# Patient Record
Sex: Female | Born: 1970 | Race: White | Hispanic: No | Marital: Single | Smoking: Former smoker
Health system: Southern US, Community
[De-identification: ages and names within clinical notes are randomized; demographics above are authoritative.]

## PROBLEM LIST (undated history)

## (undated) DIAGNOSIS — Z87442 Personal history of urinary calculi: Secondary | ICD-10-CM

## (undated) DIAGNOSIS — B029 Zoster without complications: Secondary | ICD-10-CM

## (undated) DIAGNOSIS — C801 Malignant (primary) neoplasm, unspecified: Secondary | ICD-10-CM

## (undated) DIAGNOSIS — D649 Anemia, unspecified: Secondary | ICD-10-CM

## (undated) DIAGNOSIS — E079 Disorder of thyroid, unspecified: Secondary | ICD-10-CM

## (undated) DIAGNOSIS — E039 Hypothyroidism, unspecified: Secondary | ICD-10-CM

## (undated) DIAGNOSIS — R87629 Unspecified abnormal cytological findings in specimens from vagina: Secondary | ICD-10-CM

## (undated) DIAGNOSIS — N83209 Unspecified ovarian cyst, unspecified side: Secondary | ICD-10-CM

## (undated) HISTORY — DX: Unspecified abnormal cytological findings in specimens from vagina: R87.629

## (undated) HISTORY — PX: DILATION AND CURETTAGE OF UTERUS: SHX78

## (undated) HISTORY — DX: Zoster without complications: B02.9

## (undated) HISTORY — DX: Unspecified ovarian cyst, unspecified side: N83.209

## (undated) HISTORY — DX: Anemia, unspecified: D64.9

## (undated) HISTORY — PX: CERVICAL CONE BIOPSY: SUR198

---

## 2002-04-23 DIAGNOSIS — B029 Zoster without complications: Secondary | ICD-10-CM

## 2002-04-23 HISTORY — DX: Zoster without complications: B02.9

## 2006-06-25 ENCOUNTER — Ambulatory Visit (HOSPITAL_COMMUNITY): Admission: RE | Admit: 2006-06-25 | Discharge: 2006-06-25 | Payer: Self-pay | Admitting: Family Medicine

## 2007-07-22 ENCOUNTER — Ambulatory Visit (HOSPITAL_COMMUNITY): Admission: RE | Admit: 2007-07-22 | Discharge: 2007-07-22 | Payer: Self-pay | Admitting: Family Medicine

## 2008-09-30 ENCOUNTER — Ambulatory Visit (HOSPITAL_COMMUNITY): Admission: RE | Admit: 2008-09-30 | Discharge: 2008-09-30 | Payer: Self-pay | Admitting: Family Medicine

## 2010-03-13 ENCOUNTER — Other Ambulatory Visit: Admission: RE | Admit: 2010-03-13 | Discharge: 2010-03-13 | Payer: Self-pay | Admitting: Obstetrics and Gynecology

## 2011-06-17 ENCOUNTER — Emergency Department (HOSPITAL_COMMUNITY)
Admission: EM | Admit: 2011-06-17 | Discharge: 2011-06-17 | Disposition: A | Discharge status: Home / Self Care | Payer: Self-pay | Attending: Emergency Medicine | Admitting: Emergency Medicine

## 2011-06-17 ENCOUNTER — Encounter (HOSPITAL_COMMUNITY): Payer: Self-pay

## 2011-06-17 ENCOUNTER — Emergency Department (HOSPITAL_COMMUNITY): Payer: Self-pay

## 2011-06-17 DIAGNOSIS — K921 Melena: Secondary | ICD-10-CM | POA: Insufficient documentation

## 2011-06-17 DIAGNOSIS — R1013 Epigastric pain: Secondary | ICD-10-CM | POA: Insufficient documentation

## 2011-06-17 DIAGNOSIS — R197 Diarrhea, unspecified: Secondary | ICD-10-CM | POA: Insufficient documentation

## 2011-06-17 HISTORY — DX: Malignant (primary) neoplasm, unspecified: C80.1

## 2011-06-17 HISTORY — DX: Disorder of thyroid, unspecified: E07.9

## 2011-06-17 LAB — HEPATIC FUNCTION PANEL
Bilirubin, Direct: <0.1 mg/dL (ref 0.0–0.3)
Total Protein: 7.8 g/dL (ref 6.0–8.3)

## 2011-06-17 LAB — CBC
Hemoglobin: 13.6 g/dL (ref 12.0–15.0)
MCHC: 34.4 g/dL (ref 30.0–36.0)
Platelets: 224 10*3/uL (ref 150–400)
RDW: 12.6 % (ref 11.5–15.5)

## 2011-06-17 LAB — DIFFERENTIAL
Basophils Absolute: 0 10*3/uL (ref 0.0–0.1)
Basophils Relative: 1 % (ref 0–1)
Monocytes Relative: 6 % (ref 3–12)
Neutro Abs: 2.9 10*3/uL (ref 1.7–7.7)
Neutrophils Relative %: 56 % (ref 43–77)

## 2011-06-17 LAB — URINALYSIS, ROUTINE W REFLEX MICROSCOPIC
Leukocytes, UA: NEGATIVE
Nitrite: NEGATIVE
Specific Gravity, Urine: 1.02 (ref 1.005–1.030)
Urobilinogen, UA: 0.2 mg/dL (ref 0.0–1.0)
pH: 6.5 (ref 5.0–8.0)

## 2011-06-17 LAB — BASIC METABOLIC PANEL
Chloride: 102 mEq/L (ref 96–112)
GFR calc Af Amer: >90 mL/min (ref >90)
GFR calc non Af Amer: >90 mL/min (ref >90)
Potassium: 3.8 mEq/L (ref 3.5–5.1)
Sodium: 136 mEq/L (ref 135–145)

## 2011-06-17 LAB — LIPASE, BLOOD: Lipase: 44 U/L (ref 11–59)

## 2011-06-17 LAB — URINE MICROSCOPIC-ADD ON

## 2011-06-17 MED ORDER — HYDROCODONE-ACETAMINOPHEN 5-500 MG PO TABS: 1.0000 | ORAL_TABLET | Freq: Four times a day (QID) | ORAL | Status: AC | PRN

## 2011-06-17 MED ORDER — IOHEXOL 300 MG/ML  SOLN
40.0000 mL | Freq: Once | INTRAMUSCULAR | Status: AC | PRN
  Administered 2011-06-17: 40 mL via ORAL

## 2011-06-17 MED ORDER — IOHEXOL 300 MG/ML  SOLN
100.0000 mL | Freq: Once | INTRAMUSCULAR | Status: AC | PRN
  Administered 2011-06-17: 100 mL via INTRAVENOUS

## 2011-06-17 MED ORDER — OMEPRAZOLE 20 MG PO CPDR: 20.0000 mg | DELAYED_RELEASE_CAPSULE | Freq: Two times a day (BID) | ORAL | Status: DC

## 2011-06-17 NOTE — ED Notes (Signed)
Pt reports has been having upper abd pain x 5 months.  Says pain worse after eating certain foods.  Reports intermittent diarrhea and has had streaks of bright red blood in stool.  Says is not all of the time.  Pt also reports feeling dizzy and frequent nose bleeds.

## 2011-06-17 NOTE — ED Provider Notes (Signed)
History   This chart was scribed for Cathy Lyons, MD by Clarita Crane. The patient was seen in room APA19/APA19. Patient's care was started at 1055.    CSN: [id number]  Arrival date & time 06/17/11  1055   First MD Initiated Contact with Patient 06/17/11 1114      Chief Complaint  Patient presents with  . Abdominal Pain    (Consider location/radiation/quality/duration/timing/severity/associated sxs/prior treatment) HPI Cathy Munoz is a 41 y.o. female who presents to the Emergency Department complaining of intermittent moderate abdominal pain localized to epigastrium onset 5 months ago but worse last night with associated diarrhea, episodes of hematochezia. Patient notes symptoms are aggravated by eating and is particularly worse with spicy/fatty foods. Denies nausea, vomiting, chest pain, SOB, fever. Patient with h/o CA, thyroid disease.  Past Medical History  Diagnosis Date  . Thyroid disease   . Cancer     cervical    Past Surgical History  Procedure Date  . Cervical cone biopsy     No family history on file.  History  Substance Use Topics  . Smoking status: Never Smoker   . Smokeless tobacco: Not on file  . Alcohol Use: No    OB History    Grav Para Term Preterm Abortions TAB SAB Ect Mult Living                  Review of Systems 10 Systems reviewed and are negative for acute change except as noted in the HPI.  Allergies  Sulfa antibiotics  Home Medications   Current Outpatient Rx  Name Route Sig Dispense Refill  . DESOGEST-ETH ESTRAD TRIPHASIC 0.1/0.125/0.15 -0.025 MG PO TABS Oral Take 1 tablet by mouth daily.    Marland Kitchen LEVOTHYROXINE SODIUM 112 MCG PO TABS Oral Take 112 mcg by mouth daily.      BP 137/87  Pulse 62  Temp(Src) 98.3 F (36.8 C) (Oral)  Resp 20  Ht 5\' 10"  (1.778 m)  Wt 173 lb (78.472 kg)  BMI 24.82 kg/m2  SpO2 100%  LMP 05/20/2011  Physical Exam  Nursing note and vitals reviewed. Constitutional: She is oriented to person, place,  and time. She appears well-developed and well-nourished. No distress.  HENT:  Head: Normocephalic and atraumatic.  Eyes: EOM are normal. Pupils are equal, round, and reactive to light.  Neck: Neck supple. No tracheal deviation present.  Cardiovascular: Normal rate and regular rhythm.  Exam reveals no gallop and no friction rub.   No murmur heard. Pulmonary/Chest: Effort normal. No respiratory distress. She has no wheezes. She has no rales.  Abdominal: Soft. Bowel sounds are normal. She exhibits no distension. There is no tenderness.  Musculoskeletal: Normal range of motion. She exhibits no edema.  Neurological: She is alert and oriented to person, place, and time. No sensory deficit.  Skin: Skin is warm and dry.  Psychiatric: She has a normal mood and affect. Her behavior is normal.    ED Course  Procedures (including critical care time)  DIAGNOSTIC STUDIES: COORDINATION OF CARE:    Labs Reviewed  URINALYSIS, ROUTINE W REFLEX MICROSCOPIC - Abnormal; Notable for the following:    Hgb urine dipstick LARGE (*)    All other components within normal limits  URINE MICROSCOPIC-ADD ON - Abnormal; Notable for the following:    Squamous Epithelial / LPF FEW (*)    All other components within normal limits  CBC  DIFFERENTIAL  BASIC METABOLIC PANEL  POCT PREGNANCY, URINE  LIPASE, BLOOD  HEPATIC FUNCTION PANEL  Ct Abdomen Pelvis W Contrast  06/17/2011  *RADIOLOGY REPORT*  Clinical Data: 41 year old female with abdominal and pelvic pain.  CT ABDOMEN AND PELVIS WITH CONTRAST  Technique:  Multidetector CT imaging of the abdomen and pelvis was performed following the standard protocol during bolus administration of intravenous contrast.  Contrast: OMNIPAQUE IOHEXOL 300 MG/ML IV SOLN  Comparison: None  Findings: Tiny scattered cysts within the liver noted. The spleen, right adrenal gland, gallbladder, pancreas and kidneys are unremarkable. An 8 x 13 mm left adrenal adenoma is noted. There  is no evidence of hydronephrosis or urinary calculi.  No free fluid, enlarged lymph nodes, biliary dilation or abdominal aortic aneurysm identified.  The bowel, appendix and bladder are unremarkable.  No acute or suspicious bony abnormalities are identified.  IMPRESSION: No evidence of acute abnormality.  Small left adrenal adenoma.  Original Report Authenticated By: Rosendo Gros, M.D.     No diagnosis found.    MDM  The ct and labs are all unremarkable.  Will discharge with prilosec, pain meds, outpatient ultrasound.  Gallbladder vs. GERD.        I personally performed the services described in this documentation, which was scribed in my presence. The recorded information has been reviewed and considered.     Cathy Lyons, MD 06/17/11 [phone]

## 2011-06-17 NOTE — ED Notes (Signed)
Patient ambulatory to restroom at this time. NAD noted.  

## 2011-06-17 NOTE — Discharge Instructions (Signed)

## 2011-06-17 NOTE — ED Notes (Signed)
Patient with no complaints at this time. Respirations even and unlabored. Skin warm/dry. Discharge instructions reviewed with patient at this time. Patient given opportunity to voice concerns/ask questions. IV removed per policy and band-aid applied to site. Patient discharged at this time and left Emergency Department with steady gait.  

## 2011-06-28 ENCOUNTER — Other Ambulatory Visit (HOSPITAL_COMMUNITY): Payer: Self-pay | Admitting: Family Medicine

## 2011-06-28 DIAGNOSIS — E049 Nontoxic goiter, unspecified: Secondary | ICD-10-CM

## 2011-07-03 ENCOUNTER — Ambulatory Visit (HOSPITAL_COMMUNITY)
Admission: RE | Admit: 2011-07-03 | Discharge: 2011-07-03 | Disposition: A | Discharge status: Home / Self Care | Payer: Self-pay | Source: Ambulatory Visit | Attending: Family Medicine | Admitting: Family Medicine

## 2011-07-03 DIAGNOSIS — E049 Nontoxic goiter, unspecified: Secondary | ICD-10-CM | POA: Insufficient documentation

## 2011-07-03 DIAGNOSIS — R1013 Epigastric pain: Secondary | ICD-10-CM | POA: Insufficient documentation

## 2011-07-03 DIAGNOSIS — K838 Other specified diseases of biliary tract: Secondary | ICD-10-CM | POA: Insufficient documentation

## 2011-09-28 ENCOUNTER — Other Ambulatory Visit (HOSPITAL_COMMUNITY): Payer: Self-pay | Admitting: Nurse Practitioner

## 2011-09-28 DIAGNOSIS — Z139 Encounter for screening, unspecified: Secondary | ICD-10-CM

## 2011-10-08 ENCOUNTER — Ambulatory Visit (HOSPITAL_COMMUNITY)
Admission: RE | Admit: 2011-10-08 | Discharge: 2011-10-08 | Disposition: A | Discharge status: Home / Self Care | Payer: Self-pay | Source: Ambulatory Visit | Attending: Nurse Practitioner | Admitting: Nurse Practitioner

## 2011-10-08 DIAGNOSIS — Z139 Encounter for screening, unspecified: Secondary | ICD-10-CM

## 2013-03-16 ENCOUNTER — Other Ambulatory Visit (HOSPITAL_COMMUNITY): Payer: Self-pay | Admitting: Nurse Practitioner

## 2013-03-16 DIAGNOSIS — Z139 Encounter for screening, unspecified: Secondary | ICD-10-CM

## 2013-03-23 ENCOUNTER — Ambulatory Visit (HOSPITAL_COMMUNITY): Payer: Self-pay

## 2013-03-27 ENCOUNTER — Ambulatory Visit (HOSPITAL_COMMUNITY)
Admission: RE | Admit: 2013-03-27 | Discharge: 2013-03-27 | Disposition: A | Discharge status: Home / Self Care | Payer: Self-pay | Source: Ambulatory Visit | Attending: Nurse Practitioner | Admitting: Nurse Practitioner

## 2013-03-27 DIAGNOSIS — Z139 Encounter for screening, unspecified: Secondary | ICD-10-CM

## 2013-11-01 ENCOUNTER — Encounter (HOSPITAL_COMMUNITY): Payer: Self-pay | Admitting: Emergency Medicine

## 2013-11-01 ENCOUNTER — Emergency Department (HOSPITAL_COMMUNITY)
Admission: EM | Admit: 2013-11-01 | Discharge: 2013-11-01 | Disposition: A | Discharge status: Home / Self Care | Payer: Self-pay | Attending: Emergency Medicine | Admitting: Emergency Medicine

## 2013-11-01 DIAGNOSIS — Z79899 Other long term (current) drug therapy: Secondary | ICD-10-CM | POA: Insufficient documentation

## 2013-11-01 DIAGNOSIS — L600 Ingrowing nail: Secondary | ICD-10-CM | POA: Insufficient documentation

## 2013-11-01 DIAGNOSIS — Z8541 Personal history of malignant neoplasm of cervix uteri: Secondary | ICD-10-CM | POA: Insufficient documentation

## 2013-11-01 DIAGNOSIS — E079 Disorder of thyroid, unspecified: Secondary | ICD-10-CM | POA: Insufficient documentation

## 2013-11-01 MED ORDER — CIPROFLOXACIN HCL 500 MG PO TABS: 500.0000 mg | ORAL_TABLET | Freq: Two times a day (BID) | ORAL | Status: DC

## 2013-11-01 MED ORDER — IBUPROFEN 800 MG PO TABS
800.0000 mg | ORAL_TABLET | Freq: Once | ORAL | Status: AC
  Administered 2013-11-01: 800 mg via ORAL
  Filled 2013-11-01: qty 1

## 2013-11-01 MED ORDER — CIPROFLOXACIN HCL 250 MG PO TABS
500.0000 mg | ORAL_TABLET | Freq: Once | ORAL | Status: AC
  Administered 2013-11-01: 500 mg via ORAL
  Filled 2013-11-01: qty 2

## 2013-11-01 MED ORDER — ACETAMINOPHEN 325 MG PO TABS
650.0000 mg | ORAL_TABLET | Freq: Once | ORAL | Status: AC
  Administered 2013-11-01: 650 mg via ORAL
  Filled 2013-11-01: qty 2

## 2013-11-01 MED ORDER — BACITRACIN-NEOMYCIN-POLYMYXIN 400-5-5000 EX OINT
TOPICAL_OINTMENT | CUTANEOUS | Status: AC
  Administered 2013-11-01: 16:00:00
  Filled 2013-11-01: qty 1

## 2013-11-01 MED ORDER — LIDOCAINE HCL (PF) 2 % IJ SOLN
INTRAMUSCULAR | Status: AC
  Administered 2013-11-01: 16:00:00
  Filled 2013-11-01: qty 10

## 2013-11-01 MED ORDER — HYDROCODONE-ACETAMINOPHEN 7.5-325 MG PO TABS: 1.0000 | ORAL_TABLET | ORAL | Status: DC | PRN

## 2013-11-01 NOTE — ED Provider Notes (Signed)
CSN: [id number]     Arrival date & time 11/01/13  1352 History   None  This chart was scribed for non-physician practitioner, Lily Kocher, PA-C working with Nat Christen, MD, by Thea Alken, ED Scribe. This patient was seen in room APFT20/APFT20 and the patient's care was started at 2:34 PM.  Chief Complaint  Patient presents with  . Toe Pain   Patient is a 43 y.o. female presenting with toe pain. The history is provided by the patient. No language interpreter was used.  Toe Pain This is a new problem. The current episode started more than 1 week ago (1 month). The problem has been gradually worsening.  Toe Pain This is a new problem. The current episode started more than 1 week ago (1 month). The problem has been gradually worsening. Associated symptoms include myalgias. Pertinent negatives include no chills, fever, numbness, rash or weakness.   Cathy Munoz is a 43 y.o. female who presents to the Emergency Department complaining of worsening throbbing right great toe pain x 1 month with associated erythema to toe. Pt describes the pain as sore. Pt states she has not been about to wear closed toe shoes due to pain. Pt states she has cleaned toe with peroxide as well as wrapping toe. Pt denies injury. Pt denies operation or procedure. She denies DM.   Past Medical History  Diagnosis Date  . Thyroid disease   . Cancer     cervical   Past Surgical History  Procedure Laterality Date  . Cervical cone biopsy     History reviewed. No pertinent family history. History  Substance Use Topics  . Smoking status: Never Smoker   . Smokeless tobacco: Not on file  . Alcohol Use: No   OB History   Grav Para Term Preterm Abortions TAB SAB Ect Mult Living                 Review of Systems  Constitutional: Negative for fever and chills.  Musculoskeletal: Positive for myalgias.  Skin: Negative for rash and wound.  Neurological: Negative for weakness and numbness.  All other systems reviewed and  are negative.  Allergies  Sulfa antibiotics  Home Medications   Prior to Admission medications   Medication Sig Start Date End Date Taking? Authorizing Provider  desogestrel-ethinyl estradiol (VELIVET,CAZIANT,CESIA,CYCLESSA) 0.1/0.125/0.15 -0.025 MG tablet Take 1 tablet by mouth daily.    Historical Provider, MD  levothyroxine (SYNTHROID, LEVOTHROID) 112 MCG tablet Take 112 mcg by mouth daily.    Historical Provider, MD  omeprazole (PRILOSEC) 20 MG capsule Take 1 capsule (20 mg total) by mouth 2 (two) times daily. 06/17/11 06/16/12  Veryl Speak, MD   BP 153/91  Pulse 82  Temp(Src) 97.6 F (36.4 C) (Oral)  Resp 17  Ht 5\' 8"  (1.727 m)  Wt 170 lb (77.111 kg)  BMI 25.85 kg/m2  SpO2 100%  LMP 10/03/2013 Physical Exam  Nursing note and vitals reviewed. Constitutional: She is oriented to person, place, and time. She appears well-developed and well-nourished.  Non-toxic appearance. No distress.  HENT:  Head: Normocephalic and atraumatic.  Right Ear: Tympanic membrane and external ear normal.  Left Ear: Tympanic membrane and external ear normal.  Eyes: Conjunctivae, EOM and lids are normal. Pupils are equal, round, and reactive to light.  Neck: Normal range of motion. Neck supple. Carotid bruit is not present.  Cardiovascular: Normal rate, regular rhythm, normal heart sounds, intact distal pulses and normal pulses.   Pulmonary/Chest: Effort normal and breath sounds  normal. No respiratory distress.  Abdominal: Soft. Bowel sounds are normal. There is no tenderness. There is no guarding.  Musculoskeletal: Normal range of motion.  Lymphadenopathy:       Head (right side): No submandibular adenopathy present.       Head (left side): No submandibular adenopathy present.    She has no cervical adenopathy.  Neurological: She is alert and oriented to person, place, and time. She has normal strength. No cranial nerve deficit or sensory deficit.  Skin: Skin is warm and dry.  In grown nail of  right first toe. Dry drainage to antecubital. No red streaking involving toe or foot. The toe is not hot. No lesion between toes. No puncture wound to plantar surface of foot. Dorsalis pedis pulse 2+  Psychiatric: She has a normal mood and affect. Her speech is normal and behavior is normal.    ED Course  NAIL REMOVAL Date/Time: 11/01/2013 2:57 PM Performed by: Lenox Ahr Authorized by: Lenox Ahr Consent: Verbal consent obtained. Risks and benefits: risks, benefits and alternatives were discussed Consent given by: patient Patient understanding: patient states understanding of the procedure being performed Patient identity confirmed: arm band Time out: Immediately prior to procedure a "time out" was called to verify the correct patient, procedure, equipment, support staff and site/side marked as required. Location: right foot Location details: right big toe Anesthesia: digital block Local anesthetic: lidocaine 2% without epinephrine Patient sedated: no Preparation: skin prepped with Betadine Amount removed: 1/3 Dressing: gauze roll Patient tolerance: Patient tolerated the procedure well with no immediate complications. Comments: 1/3 of the nail of the right great toe removed with sissors and #11 blade. Wound irrigated. Small amount of pus material removed. Dressing and post op shoe applied.      DIAGNOSTIC STUDIES: Oxygen Saturation is 100% on RA, normal by my interpretation.    COORDINATION OF CARE: 2:41 PM- Pt advised of plan for treatment including right great toe nail removal and pt agrees. Pt will be prescribe abx and has been advised to wear clean socks  Labs Review Labs Reviewed - No data to display  Imaging Review No results found.   EKG Interpretation None      MDM Patient is ingrown nail involving the right first toe. Small amount of pus material removed, increased redness of the great toe is noted. Patient is placed on hydrocodone for pain, and  Cipro twice a day. Patient is to return to the emergency apartment if any changes, problems, or concerns.    Final diagnoses:  None    I personally performed the services described in this documentation, which was scribed in my presence. The recorded information has been reviewed and is accurate.     Lenox Ahr, PA-C 11/02/13 [phone]

## 2013-11-01 NOTE — Discharge Instructions (Signed)
Started of, July 13, please soak your foot in warm salt water for 10-15 minutes daily until the wound has healed. Please change her dressing, or Band-Aid daily. Please use clean white socks daily. Please use the postoperative shoe until you're able to safely use your regular shoes. Please see your primary physician, or return to the emergency department if any signs of advancing infection. Please use Cipro 2 times daily with food. May use Tylenol or ibuprofen for mild pain, use Norco for more severe pain. This medication may cause drowsiness, please use with caution. Infected Ingrown Toenail An infected ingrown toenail occurs when the nail edge grows into the skin and bacteria invade the area. Symptoms include pain, tenderness, swelling, and pus drainage from the edge of the nail. Poorly fitting shoes, minor injuries, and improper cutting of the toenail may also contribute to the problem. You should cut your toenails squarely instead of rounding the edges. Do not cut them too short. Avoid tight or pointed toe shoes. Sometimes the ingrown portion of the nail must be removed. If your toenail is removed, it can take 3-4 months for it to re-grow. HOME CARE INSTRUCTIONS   Soak your infected toe in warm water for 20-30 minutes, 2 to 3 times a day.  Packing or dressings applied to the area should be changed daily.  Take medicine as directed and finish them.  Reduce activities and keep your foot elevated when able to reduce swelling and discomfort. Do this until the infection gets better.  Wear sandals or go barefoot as much as possible while the infected area is sensitive.  See your caregiver for follow-up care in 2-3 days if the infection is not better. SEEK MEDICAL CARE IF:  Your toe is becoming more red, swollen or painful. MAKE SURE YOU:   Understand these instructions.  Will watch your condition.  Will get help right away if you are not doing well or get worse. Document Released: 05/17/2004  Document Revised: 07/02/2011 Document Reviewed: 04/05/2008 [facility] Patient Information 2015 [place], [place]. This information is not intended to replace advice given to you by your health care provider. Make sure you discuss any questions you have with your health care provider.

## 2013-11-01 NOTE — ED Notes (Signed)
Patient with no complaints at this time. Respirations even and unlabored. Skin warm/dry. Discharge instructions reviewed with patient at this time. Patient given opportunity to voice concerns/ask questions. Patient discharged at this time and left Emergency Department with steady gait.   

## 2013-11-01 NOTE — ED Notes (Signed)
Applied neosporin, telfa and gauze dressing to R great toe.  Applied post-op shoe.

## 2013-11-01 NOTE — ED Notes (Signed)
R great toe reddened around base and lateral aspect.  Has been hurting x 1 month. Has been using H2O2 to clean.

## 2013-11-05 NOTE — ED Provider Notes (Signed)
Medical screening examination/treatment/procedure(s) were performed by non-physician practitioner and as supervising physician I was immediately available for consultation/collaboration.   EKG Interpretation None       Nat Christen, MD 11/05/13 1905

## 2014-04-08 ENCOUNTER — Other Ambulatory Visit (HOSPITAL_COMMUNITY): Payer: Self-pay | Admitting: Nurse Practitioner

## 2014-04-08 DIAGNOSIS — Z139 Encounter for screening, unspecified: Secondary | ICD-10-CM

## 2014-04-28 ENCOUNTER — Ambulatory Visit (HOSPITAL_COMMUNITY)
Admission: RE | Admit: 2014-04-28 | Discharge: 2014-04-28 | Disposition: A | Discharge status: Home / Self Care | Payer: Self-pay | Source: Ambulatory Visit | Attending: Nurse Practitioner | Admitting: Nurse Practitioner

## 2014-04-28 DIAGNOSIS — Z139 Encounter for screening, unspecified: Secondary | ICD-10-CM

## 2014-08-16 ENCOUNTER — Encounter (HOSPITAL_COMMUNITY): Payer: Self-pay | Admitting: Emergency Medicine

## 2014-08-16 ENCOUNTER — Emergency Department (HOSPITAL_COMMUNITY): Payer: No Typology Code available for payment source

## 2014-08-16 ENCOUNTER — Emergency Department (HOSPITAL_COMMUNITY)
Admission: EM | Admit: 2014-08-16 | Discharge: 2014-08-16 | Disposition: A | Discharge status: Home / Self Care | Payer: No Typology Code available for payment source | Attending: Emergency Medicine | Admitting: Emergency Medicine

## 2014-08-16 DIAGNOSIS — S5011XA Contusion of right forearm, initial encounter: Secondary | ICD-10-CM | POA: Insufficient documentation

## 2014-08-16 DIAGNOSIS — Y998 Other external cause status: Secondary | ICD-10-CM | POA: Diagnosis not present

## 2014-08-16 DIAGNOSIS — Y92410 Unspecified street and highway as the place of occurrence of the external cause: Secondary | ICD-10-CM | POA: Diagnosis not present

## 2014-08-16 DIAGNOSIS — Y9389 Activity, other specified: Secondary | ICD-10-CM | POA: Insufficient documentation

## 2014-08-16 DIAGNOSIS — Z79899 Other long term (current) drug therapy: Secondary | ICD-10-CM | POA: Diagnosis not present

## 2014-08-16 DIAGNOSIS — Z8541 Personal history of malignant neoplasm of cervix uteri: Secondary | ICD-10-CM | POA: Insufficient documentation

## 2014-08-16 DIAGNOSIS — S59911A Unspecified injury of right forearm, initial encounter: Secondary | ICD-10-CM | POA: Diagnosis present

## 2014-08-16 DIAGNOSIS — E079 Disorder of thyroid, unspecified: Secondary | ICD-10-CM | POA: Insufficient documentation

## 2014-08-16 DIAGNOSIS — Z792 Long term (current) use of antibiotics: Secondary | ICD-10-CM | POA: Insufficient documentation

## 2014-08-16 MED ORDER — HYDROCODONE-ACETAMINOPHEN 5-325 MG PO TABS
1.0000 | ORAL_TABLET | ORAL | Status: DC | PRN
Start: 2014-08-16 — End: 2019-07-07

## 2014-08-16 MED ORDER — IBUPROFEN 800 MG PO TABS: 800.0000 mg | ORAL_TABLET | Freq: Three times a day (TID) | ORAL | Status: DC
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
## 2014-09-27 ENCOUNTER — Telehealth: Payer: Self-pay | Admitting: Neurology

## 2014-09-27 NOTE — Telephone Encounter (Signed)
Patient called and stated that she just missed a call from Dr. Jaynee Eagles and was returning the call. PC&A.

## 2014-09-27 NOTE — Telephone Encounter (Signed)
Left VM apologizing that we called her by mistake. Told her to disregard last phone call.

## 2015-05-05 ENCOUNTER — Other Ambulatory Visit (HOSPITAL_COMMUNITY): Payer: Self-pay | Admitting: Nurse Practitioner

## 2015-05-05 DIAGNOSIS — Z1231 Encounter for screening mammogram for malignant neoplasm of breast: Secondary | ICD-10-CM

## 2015-05-13 ENCOUNTER — Ambulatory Visit (HOSPITAL_COMMUNITY)
Admission: RE | Admit: 2015-05-13 | Discharge: 2015-05-13 | Disposition: A | Discharge status: Home / Self Care | Payer: Self-pay | Source: Ambulatory Visit | Attending: Nurse Practitioner | Admitting: Nurse Practitioner

## 2015-05-13 DIAGNOSIS — Z1231 Encounter for screening mammogram for malignant neoplasm of breast: Secondary | ICD-10-CM

## 2016-05-15 ENCOUNTER — Other Ambulatory Visit (HOSPITAL_COMMUNITY): Payer: Self-pay | Admitting: Nurse Practitioner

## 2016-05-15 DIAGNOSIS — Z1231 Encounter for screening mammogram for malignant neoplasm of breast: Secondary | ICD-10-CM

## 2016-06-04 ENCOUNTER — Encounter (HOSPITAL_COMMUNITY): Payer: Self-pay

## 2016-06-06 ENCOUNTER — Encounter: Payer: Self-pay | Admitting: *Deleted

## 2016-06-18 ENCOUNTER — Encounter: Payer: Self-pay | Admitting: Obstetrics & Gynecology

## 2016-06-29 ENCOUNTER — Ambulatory Visit (HOSPITAL_COMMUNITY)
Admission: RE | Admit: 2016-06-29 | Discharge: 2016-06-29 | Disposition: A | Discharge status: Home / Self Care | Payer: Self-pay | Source: Ambulatory Visit | Attending: Nurse Practitioner | Admitting: Nurse Practitioner

## 2016-06-29 DIAGNOSIS — Z1231 Encounter for screening mammogram for malignant neoplasm of breast: Secondary | ICD-10-CM

## 2017-06-17 ENCOUNTER — Other Ambulatory Visit (HOSPITAL_COMMUNITY): Payer: Self-pay | Admitting: Nurse Practitioner

## 2017-06-17 DIAGNOSIS — N949 Unspecified condition associated with female genital organs and menstrual cycle: Secondary | ICD-10-CM

## 2017-06-19 ENCOUNTER — Other Ambulatory Visit: Payer: Self-pay | Admitting: Nurse Practitioner

## 2017-06-19 DIAGNOSIS — Z1231 Encounter for screening mammogram for malignant neoplasm of breast: Secondary | ICD-10-CM

## 2017-06-20 ENCOUNTER — Ambulatory Visit (HOSPITAL_COMMUNITY)
Admission: RE | Admit: 2017-06-20 | Discharge: 2017-06-20 | Disposition: A | Discharge status: Home / Self Care | Payer: Self-pay | Source: Ambulatory Visit | Attending: Nurse Practitioner | Admitting: Nurse Practitioner

## 2017-06-20 DIAGNOSIS — N8302 Follicular cyst of left ovary: Secondary | ICD-10-CM | POA: Insufficient documentation

## 2017-06-20 DIAGNOSIS — R9389 Abnormal findings on diagnostic imaging of other specified body structures: Secondary | ICD-10-CM | POA: Insufficient documentation

## 2017-06-20 DIAGNOSIS — N949 Unspecified condition associated with female genital organs and menstrual cycle: Secondary | ICD-10-CM | POA: Insufficient documentation

## 2017-09-23 ENCOUNTER — Encounter: Payer: Self-pay | Admitting: Obstetrics and Gynecology

## 2017-10-09 ENCOUNTER — Other Ambulatory Visit: Payer: Self-pay

## 2017-10-09 ENCOUNTER — Encounter: Payer: Self-pay | Admitting: Obstetrics and Gynecology

## 2017-10-09 ENCOUNTER — Ambulatory Visit (INDEPENDENT_AMBULATORY_CARE_PROVIDER_SITE_OTHER): Payer: Self-pay | Admitting: Obstetrics and Gynecology

## 2017-10-09 VITALS — BP 139/90 | HR 84 | Ht 70.0 in | Wt 171.0 lb

## 2017-10-09 DIAGNOSIS — N904 Leukoplakia of vulva: Secondary | ICD-10-CM

## 2017-10-09 NOTE — Progress Notes (Addendum)
Patient ID: Illene Silver, female   DOB: 1970-09-07, 47 y.o.   MRN: [id number]   [facility] Visit  @DATE @            Patient name: JAMERA VANLOAN MRN [id number]  Date of birth: 1970/11/07  CC & HPI: Referral from health department as part of [facility] discount program  MEAH JIRON is a 47 y.o. female presenting today for labial agglutination. Associated symptoms include itchiness. She was referred here by the health department. She continues to have her menstrual cycle, adding she had it twice this month. The patient denies fever, chills or any other symptoms or complaints at this time.   ROS:  ROS +labial agglutination +itchiness, vufulva -fever -chills All systems are negative except as noted in the HPI and PMH.   Pertinent History Reviewed:   Reviewed: Significant for cervical dysplasia=> [facility] 20 yr ago , left ovarian cyst, abnormal pap, conization and [facility]         Past Medical History:  Diagnosis Date  . Cancer (HCC)    cervical  . Ovarian cyst    left  . Shingles 2004  . Thyroid disease    hypothyroidism  . Vaginal Pap smear, abnormal    conization 1991                              Surgical Hx:    Past Surgical History:  Procedure Laterality Date  . CERVICAL CONE BIOPSY    . DILATION AND CURETTAGE OF UTERUS     Medications: Reviewed & Updated - see associated section                       Current Outpatient Medications:  .  acyclovir (ZOVIRAX) 200 MG capsule, Take 200 mg by mouth 3 (three) times daily., Disp: , Rfl:  .  HYDROcodone-acetaminophen (NORCO/VICODIN) 5-325 MG per tablet, Take 1 tablet by mouth every 4 (four) hours as needed., Disp: 15 tablet, Rfl: 0 .  ibuprofen (ADVIL,MOTRIN) 800 MG tablet, Take 1 tablet (800 mg total) by mouth 3 (three) times daily., Disp: 21 tablet, Rfl: 0 .  levothyroxine (SYNTHROID, LEVOTHROID) 112 MCG tablet, Take 112 mcg by mouth daily., Disp: , Rfl:  .  metoprolol tartrate (LOPRESSOR) 25 MG tablet, Take 25 mg by  mouth daily., Disp: , Rfl:  .  triamcinolone ointment (KENALOG) 0.1 %, Apply 1 application topically 2 (two) times daily., Disp: , Rfl:  .  desogestrel-ethinyl estradiol (VELIVET,CAZIANT,CESIA,CYCLESSA) 0.1/0.125/0.15 -0.025 MG tablet, Take 1 tablet by mouth daily., Disp: , Rfl:  .  loratadine (CLARITIN) 10 MG tablet, Take 10 mg by mouth daily., Disp: , Rfl:  .  nystatin cream (MYCOSTATIN), Apply 1 application topically 2 (two) times daily., Disp: , Rfl:  .  omeprazole (PRILOSEC) 20 MG capsule, Take 1 capsule (20 mg total) by mouth 2 (two) times daily. (Patient not taking: Reported on 08/16/2014), Disp: 20 capsule, Rfl: 0   Social History: Reviewed -  reports that she has never smoked. She has never used smokeless tobacco.  Objective Findings:  Vitals: Blood pressure 139/90, pulse 84, height 5\' 10"  (1.778 m), weight 171 lb (77.6 kg), last menstrual period 09/26/2017.  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 Perimenopausal menstrual history 2. Atrophic Vulvar dystrophy  P: testosterone Propionate  1.  2% testosterone mixed  in petrolatum called to [place]  By signing my name below, I, Margit Banda, attest that this documentation has been prepared under the direction and in the presence of Jonnie Kind, MD. Electronically Signed: Margit Banda, Medical Scribe. 10/09/17. 10:45 AM.  I personally performed the services described in this documentation, which was SCRIBED in my presence. The recorded information has been  reviewed and considered accurate. It has been edited as necessary during review. Jonnie Kind, MD

## 2019-05-03 ENCOUNTER — Encounter: Payer: Self-pay | Admitting: *Deleted

## 2019-07-07 ENCOUNTER — Other Ambulatory Visit: Payer: Self-pay

## 2019-07-07 ENCOUNTER — Emergency Department (HOSPITAL_COMMUNITY)
Admission: EM | Admit: 2019-07-07 | Discharge: 2019-07-07 | Disposition: A | Discharge status: Home / Self Care | Payer: Self-pay | Attending: Emergency Medicine | Admitting: Emergency Medicine

## 2019-07-07 ENCOUNTER — Encounter (HOSPITAL_COMMUNITY): Payer: Self-pay | Admitting: *Deleted

## 2019-07-07 ENCOUNTER — Emergency Department (HOSPITAL_COMMUNITY): Payer: Self-pay

## 2019-07-07 DIAGNOSIS — K529 Noninfective gastroenteritis and colitis, unspecified: Secondary | ICD-10-CM | POA: Insufficient documentation

## 2019-07-07 DIAGNOSIS — Z79899 Other long term (current) drug therapy: Secondary | ICD-10-CM | POA: Insufficient documentation

## 2019-07-07 DIAGNOSIS — R1032 Left lower quadrant pain: Secondary | ICD-10-CM

## 2019-07-07 DIAGNOSIS — E039 Hypothyroidism, unspecified: Secondary | ICD-10-CM | POA: Insufficient documentation

## 2019-07-07 LAB — COMPREHENSIVE METABOLIC PANEL
ALT: 11 U/L (ref 0–44)
AST: 17 U/L (ref 15–41)
Albumin: 4.1 g/dL (ref 3.5–5.0)
Alkaline Phosphatase: 60 U/L (ref 38–126)
Anion gap: 8 (ref 5–15)
BUN: 13 mg/dL (ref 6–20)
CO2: 25 mmol/L (ref 22–32)
Calcium: 8.8 mg/dL — ABNORMAL LOW (ref 8.9–10.3)
Chloride: 105 mmol/L (ref 98–111)
Creatinine, Ser: 0.64 mg/dL (ref 0.44–1.00)
GFR calc Af Amer: >60 mL/min (ref >60)
GFR calc non Af Amer: >60 mL/min (ref >60)
Glucose, Bld: 101 mg/dL — ABNORMAL HIGH (ref 70–99)
Potassium: 3.5 mmol/L (ref 3.5–5.1)
Sodium: 138 mmol/L (ref 135–145)
Total Bilirubin: 0.4 mg/dL (ref 0.3–1.2)
Total Protein: 7.6 g/dL (ref 6.5–8.1)

## 2019-07-07 LAB — URINALYSIS, ROUTINE W REFLEX MICROSCOPIC
Bilirubin Urine: NEGATIVE
Glucose, UA: NEGATIVE mg/dL
Ketones, ur: NEGATIVE mg/dL
Leukocytes,Ua: NEGATIVE
Nitrite: NEGATIVE
Protein, ur: NEGATIVE mg/dL
Specific Gravity, Urine: 1.019 (ref 1.005–1.030)
pH: 6 (ref 5.0–8.0)

## 2019-07-07 LAB — CBC
HCT: 33.9 % — ABNORMAL LOW (ref 36.0–46.0)
Hemoglobin: 10.4 g/dL — ABNORMAL LOW (ref 12.0–15.0)
MCH: 26.9 pg (ref 26.0–34.0)
MCHC: 30.7 g/dL (ref 30.0–36.0)
MCV: 87.6 fL (ref 80.0–100.0)
Platelets: 251 10*3/uL (ref 150–400)
RBC: 3.87 MIL/uL (ref 3.87–5.11)
RDW: 14.5 % (ref 11.5–15.5)
WBC: 4.8 10*3/uL (ref 4.0–10.5)
nRBC: 0 % (ref 0.0–0.2)

## 2019-07-07 LAB — LIPASE, BLOOD: Lipase: 30 U/L (ref 11–51)

## 2019-07-07 LAB — POC URINE PREG, ED: Preg Test, Ur: NEGATIVE

## 2019-07-07 MED ORDER — CIPROFLOXACIN HCL 250 MG PO TABS
500.0000 mg | ORAL_TABLET | Freq: Once | ORAL | Status: AC
  Administered 2019-07-07: 500 mg via ORAL
  Filled 2019-07-07: qty 2

## 2019-07-07 MED ORDER — ONDANSETRON HCL 4 MG/2ML IJ SOLN
4.0000 mg | Freq: Once | INTRAMUSCULAR | Status: AC
  Administered 2019-07-07: 4 mg via INTRAVENOUS
  Filled 2019-07-07: qty 2

## 2019-07-07 MED ORDER — SODIUM CHLORIDE 0.9 % IV BOLUS
1000.0000 mL | Freq: Once | INTRAVENOUS | Status: AC
  Administered 2019-07-07: 14:00:00 1000 mL via INTRAVENOUS

## 2019-07-07 MED ORDER — DICYCLOMINE HCL 20 MG PO TABS: 20.0000 mg | ORAL_TABLET | Freq: Two times a day (BID) | ORAL | 0 refills | Status: DC

## 2019-07-07 MED ORDER — ONDANSETRON 4 MG PO TBDP: ORAL_TABLET | ORAL | 0 refills | Status: DC

## 2019-07-07 MED ORDER — MORPHINE SULFATE (PF) 4 MG/ML IV SOLN
4.0000 mg | Freq: Once | INTRAVENOUS | Status: AC
  Administered 2019-07-07: 4 mg via INTRAVENOUS
  Filled 2019-07-07: qty 1

## 2019-07-07 MED ORDER — CIPROFLOXACIN HCL 500 MG PO TABS: 500.0000 mg | ORAL_TABLET | Freq: Two times a day (BID) | ORAL | 0 refills | Status: DC

## 2019-07-07 MED ORDER — IOHEXOL 300 MG/ML  SOLN
100.0000 mL | Freq: Once | INTRAMUSCULAR | Status: AC | PRN
  Administered 2019-07-07: 100 mL via INTRAVENOUS

## 2019-07-07 NOTE — ED Provider Notes (Signed)
[facility] EMERGENCY DEPARTMENT Provider Note   CSN: [id number] Arrival date & time: 07/07/19  1124     History Chief Complaint  Patient presents with  . Abdominal Pain    Cathy Munoz is a 49 y.o. female.  Cathy Munoz is a 49 y.o. female with history of ovarian cysts, cervical cancer, hypothyroidism, who presents to the emergency department for evaluation of left lower quadrant abdominal pain.  She states this pain has been occurring intermittently for about a month.  She states she will get sudden onset of severe cramping pain in the left lower quadrant typically accompanied by an episode of diarrhea.  She states that her stools have been sticky and a dark brown color.  She has not noticed any blood in her stools.  She states that she has had worsening nausea recently but has not been vomiting.  She has not had any fevers or chills.  She states that she was seen about 2 months ago at [facility] emergency department for right-sided abdominal pain and was diagnosed with a kidney stone but states this feels different.  She denies any associated dysuria or urinary frequency, no hematuria.  She denies vaginal bleeding or discharge.        Past Medical History:  Diagnosis Date  . Cancer (HCC)    cervical  . Ovarian cyst    left  . Shingles 2004  . Thyroid disease    hypothyroidism  . Vaginal Pap smear, abnormal    conization 1991    There are no problems to display for this patient.   Past Surgical History:  Procedure Laterality Date  . CERVICAL CONE BIOPSY    . DILATION AND CURETTAGE OF UTERUS       OB History    Gravida  2   Para  1   Term  1   Preterm      AB  1   Living  1     SAB  1   TAB      Ectopic      Multiple      Live Births              Family History  Problem Relation Age of Onset  . Heart failure Father   . Anuerysm Father   . Asthma Father   . Diabetes Father   . Abdominal Wall Hernia Father   . Hyperlipidemia Father   .  Cancer Sister        kidney cancer  . Cancer Maternal Uncle        pancreatic  . Hypertension Maternal Aunt   . Cancer Paternal Aunt        breast  . Stroke Mother     Social History   Tobacco Use  . Smoking status: Never Smoker  . Smokeless tobacco: Never Used  Substance Use Topics  . Alcohol use: No  . Drug use: No    Home Medications Prior to Admission medications   Medication Sig Start Date End Date Taking? Authorizing Provider  levothyroxine (SYNTHROID, LEVOTHROID) 112 MCG tablet Take 112 mcg by mouth daily.   Yes [provider]  loratadine (CLARITIN) 10 MG tablet Take 10 mg by mouth daily as needed for allergies.    Yes [provider]  nystatin cream (MYCOSTATIN) Apply 1 application topically 2 (two) times daily as needed for dry skin.    Yes [provider]  triamcinolone ointment (KENALOG) 0.1 % Apply 1 application topically  2 (two) times daily as needed.    Yes [provider]  ciprofloxacin (CIPRO) 500 MG tablet Take 1 tablet (500 mg total) by mouth every 12 (twelve) hours. 07/07/19   Jacqlyn Larsen, PA-C  dicyclomine (BENTYL) 20 MG tablet Take 1 tablet (20 mg total) by mouth 2 (two) times daily. 07/07/19   Jacqlyn Larsen, PA-C  ondansetron (ZOFRAN ODT) 4 MG disintegrating tablet 4mg  ODT q4 hours prn nausea/vomit 07/07/19   Jacqlyn Larsen, PA-C    Allergies    Sulfa antibiotics  Review of Systems   Review of Systems  Constitutional: Negative for chills and fever.  HENT: Negative.   Respiratory: Negative for cough and shortness of breath.   Cardiovascular: Negative for chest pain.  Gastrointestinal: Positive for abdominal pain, diarrhea and nausea. Negative for vomiting.  Genitourinary: Negative for dysuria, frequency, hematuria, vaginal bleeding and vaginal discharge.  Musculoskeletal: Negative for arthralgias and myalgias.  Skin: Negative for color change and rash.  Neurological: Negative for dizziness, syncope and  light-headedness.    Physical Exam Updated Vital Signs BP (!) 172/92   Pulse 87   Temp 97.9 F (36.6 C)   Resp 20   Ht 5\' 10"  (1.778 m)   Wt 70.8 kg   LMP 06/17/2019   SpO2 100%   BMI 22.38 kg/m   Physical Exam Vitals and nursing note reviewed.  Constitutional:      General: She is not in acute distress.    Appearance: She is well-developed and normal weight. She is not ill-appearing or diaphoretic.  HENT:     Head: Normocephalic and atraumatic.     Mouth/Throat:     Mouth: Mucous membranes are moist.     Pharynx: Oropharynx is clear.  Eyes:     General:        Right eye: No discharge.        Left eye: No discharge.     Pupils: Pupils are equal, round, and reactive to light.  Cardiovascular:     Rate and Rhythm: Normal rate and regular rhythm.     Heart sounds: Normal heart sounds. No murmur. No friction rub. No gallop.   Pulmonary:     Effort: Pulmonary effort is normal. No respiratory distress.     Breath sounds: Normal breath sounds. No wheezing or rales.     Comments: Respirations equal and unlabored, patient able to speak in full sentences, lungs clear to auscultation bilaterally Abdominal:     General: Bowel sounds are normal. There is no distension.     Palpations: Abdomen is soft. There is no mass.     Tenderness: There is abdominal tenderness in the left lower quadrant. There is no guarding.     Comments: Abdomen is soft, nondistended, bowel sounds are present throughout, patient with some focal left lower quadrant tenderness on exam without guarding or rebound tenderness.  No CVA tenderness bilaterally.  Musculoskeletal:        General: No deformity.     Cervical back: Neck supple.  Skin:    General: Skin is warm and dry.     Capillary Refill: Capillary refill takes less than 2 seconds.  Neurological:     Mental Status: She is alert and oriented to person, place, and time.     Coordination: Coordination normal.     Comments: Speech is clear, able to  follow commands Moves extremities without ataxia, coordination intact  Psychiatric:        Mood and Affect: Mood normal.  Behavior: Behavior normal.     ED Results / Procedures / Treatments   Labs (all labs ordered are listed, but only abnormal results are displayed) Labs Reviewed  COMPREHENSIVE METABOLIC PANEL - Abnormal; Notable for the following components:      Result Value   Glucose, Bld 101 (*)    Calcium 8.8 (*)    All other components within normal limits  CBC - Abnormal; Notable for the following components:   Hemoglobin 10.4 (*)    HCT 33.9 (*)    All other components within normal limits  URINALYSIS, ROUTINE W REFLEX MICROSCOPIC - Abnormal; Notable for the following components:   Hgb urine dipstick MODERATE (*)    Bacteria, UA RARE (*)    All other components within normal limits  LIPASE, BLOOD  POC URINE PREG, ED    EKG None  Radiology CT ABDOMEN PELVIS W CONTRAST  Result Date: 07/07/2019 CLINICAL DATA:  Left lower quadrant abdominal pain. History of cervical cancer. EXAM: CT ABDOMEN AND PELVIS WITH CONTRAST TECHNIQUE: Multidetector CT imaging of the abdomen and pelvis was performed using the standard protocol following bolus administration of intravenous contrast. CONTRAST:  158mL OMNIPAQUE IOHEXOL 300 MG/ML  SOLN COMPARISON:  May 03, 2019 FINDINGS: Lower chest: The lung bases are clear. The heart size is normal. Hepatobiliary: Multiple hepatic cysts are noted. Normal gallbladder.There is no biliary ductal dilation. Pancreas: Normal contours without ductal dilatation. No peripancreatic fluid collection. Spleen: No splenic laceration or hematoma. Adrenals/Urinary Tract: --Adrenal glands: There is a stable left. --Right kidney/ureter: No hydronephrosis or perinephric hematoma. --Left kidney/ureter: No hydronephrosis or perinephric hematoma. --Urinary bladder: Unremarkable. Stomach/Bowel: --Stomach/Duodenum: No hiatal hernia or other gastric abnormality. Normal  duodenal course and caliber. --Small bowel: There is some mild enhancement of the small bowel in the patient's left mid abdomen and left lower quadrant. No evidence for small bowel obstruction. --Colon: No focal abnormality. --Appendix: Normal. Vascular/Lymphatic: Atherosclerotic calcification is present within the non-aneurysmal abdominal aorta, without hemodynamically significant stenosis. 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 This is essentially stable since 2013 and requires no further follow-up. --No retroperitoneal lymphadenopathy. --No mesenteric lymphadenopathy. --No pelvic or inguinal lymphadenopathy. Reproductive: There is a probable fibroid involving the uterine fundus as before. Other: No ascites or free air. The abdominal wall is normal. Musculoskeletal. No acute displaced fractures. IMPRESSION: 1. There is some mild enhancement of the small bowel in the left mid abdomen and left lower quadrant. Findings are nonspecific but may represent enteritis. No bowel obstruction. 2. Aortic Atherosclerosis (ICD10-I70.0). Electronically Signed   By: Constance Holster M.D.   On: 07/07/2019 15:03    Procedures Procedures (including critical care time)  Medications Ordered in ED Medications  ciprofloxacin (CIPRO) tablet 500 mg (has no administration in time range)  sodium chloride 0.9 % bolus 1,000 mL (1,000 mLs Intravenous New Bag/Given 07/07/19 1330)  ondansetron (ZOFRAN) injection 4 mg (4 mg Intravenous Given 07/07/19 1330)  morphine 4 MG/ML injection 4 mg (4 mg Intravenous Given 07/07/19 1331)  iohexol (OMNIPAQUE) 300 MG/ML solution 100 mL (100 mLs Intravenous Contrast Given 07/07/19 1429)    ED Course  I have reviewed the triage vital signs and the nursing notes.  Pertinent labs & imaging results that were available during my care of the patient were reviewed by me and considered in my medical decision making (see chart for details).    MDM Rules/Calculators/A&P  Patient presents to the ED with complaints of abdominal pain. Patient nontoxic appearing, in no apparent distress, vitals WNL aside from HTN. On exam patient tender to palpation in LLQ, no peritoneal signs. Will evaluate with labs and CT to assess for diverticulitis given worsening pain recently. Analgesics, anti-emetics, and fluids administered.   ER work-up reviewed:  CBC: No leukocytosis, hgb 10.4, no recent comparison available but no blood in stools or melena reported CMP: No electrolyte derangements that require intervention, normal renal and liver function Lipase: WNL UA: rare bacteria with no other signs of infection and no urinarysx Preg test: neg Imaging: mild enhancement of the bowel wall in the left lower quadrant concerning for enteritis, no evidence of obstruction, no other abnormalities noted  On repeat abdominal exam patient remains without peritoneal signs, CT shows likely enteritis in LLQ, given persistent sx will treat with abx. Patient tolerating PO in the emergency department. Will discharge home with abx, bentyl and zofran. I discussed results, treatment plan, need for PCP/GI follow-up, and return precautions with the patient. Provided opportunity for questions, patient confirmed understanding and is in agreement with plan.    Final Clinical Impression(s) / ED Diagnoses Final diagnoses:  Enteritis  Left lower quadrant abdominal pain    Rx / DC Orders ED Discharge Orders         Ordered    ciprofloxacin (CIPRO) 500 MG tablet  Every 12 hours     07/07/19 1522    dicyclomine (BENTYL) 20 MG tablet  2 times daily     07/07/19 1522    ondansetron (ZOFRAN ODT) 4 MG disintegrating tablet     07/07/19 1522           Benedetto Goad Caro, [place] 07/07/19 1524    Varney Biles, MD 07/07/19 1649

## 2019-07-07 NOTE — ED Notes (Addendum)
Pt stated " Felt dizzy when using the restroom." Pt. Stated they have also experienced "racing heart" while using the restroom.

## 2019-07-07 NOTE — ED Triage Notes (Signed)
Left lower quadrant pain onset a month ago, seen by [facility] for same

## 2019-07-07 NOTE — Discharge Instructions (Addendum)
Your lab work looks good, CT scan shows inflammation of the bowels in the left lower area of your abdomen where you are having pain.  Please take antibiotics and see if this helps treat this otherwise it could be an inflammatory condition, it is important that you follow-up with a GI doctor, please call to schedule an appointment with Dr. Oneida Alar.  In the meantime you can take Bentyl to help with cramping pain, Zofran as needed for nausea.  It is also important to eat a mild diet, please read the information provided to help improve pain and diarrhea.  If you develop blood in your stool, worsening pain, persistent vomiting, fevers or any other new or concerning symptoms please return to the emergency department for reevaluation.

## 2019-08-05 ENCOUNTER — Other Ambulatory Visit (HOSPITAL_COMMUNITY): Payer: Self-pay | Admitting: Nurse Practitioner

## 2019-08-05 ENCOUNTER — Encounter: Payer: Self-pay | Admitting: Gastroenterology

## 2019-08-05 ENCOUNTER — Other Ambulatory Visit: Payer: Self-pay | Admitting: Nurse Practitioner

## 2019-08-05 DIAGNOSIS — N920 Excessive and frequent menstruation with regular cycle: Secondary | ICD-10-CM

## 2019-08-05 DIAGNOSIS — Z1231 Encounter for screening mammogram for malignant neoplasm of breast: Secondary | ICD-10-CM

## 2019-08-07 ENCOUNTER — Other Ambulatory Visit: Payer: Self-pay

## 2019-08-07 ENCOUNTER — Ambulatory Visit (HOSPITAL_COMMUNITY)
Admission: RE | Admit: 2019-08-07 | Discharge: 2019-08-07 | Disposition: A | Discharge status: Home / Self Care | Payer: Self-pay | Source: Ambulatory Visit | Attending: Nurse Practitioner | Admitting: Nurse Practitioner

## 2019-08-07 DIAGNOSIS — Z1231 Encounter for screening mammogram for malignant neoplasm of breast: Secondary | ICD-10-CM | POA: Insufficient documentation

## 2019-08-10 ENCOUNTER — Other Ambulatory Visit: Payer: Self-pay

## 2019-08-10 ENCOUNTER — Ambulatory Visit (HOSPITAL_COMMUNITY)
Admission: RE | Admit: 2019-08-10 | Discharge: 2019-08-10 | Disposition: A | Discharge status: Home / Self Care | Payer: Self-pay | Source: Ambulatory Visit | Attending: Nurse Practitioner | Admitting: Nurse Practitioner

## 2019-08-10 DIAGNOSIS — N920 Excessive and frequent menstruation with regular cycle: Secondary | ICD-10-CM | POA: Insufficient documentation

## 2019-08-12 ENCOUNTER — Encounter (INDEPENDENT_AMBULATORY_CARE_PROVIDER_SITE_OTHER): Payer: Self-pay | Admitting: Gastroenterology

## 2019-09-15 ENCOUNTER — Other Ambulatory Visit (HOSPITAL_COMMUNITY): Payer: Self-pay | Admitting: *Deleted

## 2019-09-15 DIAGNOSIS — R928 Other abnormal and inconclusive findings on diagnostic imaging of breast: Secondary | ICD-10-CM

## 2019-09-22 ENCOUNTER — Other Ambulatory Visit: Payer: Self-pay

## 2019-09-22 ENCOUNTER — Other Ambulatory Visit (HOSPITAL_COMMUNITY)
Admission: RE | Admit: 2019-09-22 | Discharge: 2019-09-22 | Disposition: A | Discharge status: Home / Self Care | Payer: Self-pay | Source: Ambulatory Visit | Attending: Gastroenterology | Admitting: Gastroenterology

## 2019-09-22 ENCOUNTER — Other Ambulatory Visit (INDEPENDENT_AMBULATORY_CARE_PROVIDER_SITE_OTHER): Payer: Self-pay | Admitting: *Deleted

## 2019-09-22 ENCOUNTER — Ambulatory Visit (INDEPENDENT_AMBULATORY_CARE_PROVIDER_SITE_OTHER): Payer: Self-pay | Admitting: Gastroenterology

## 2019-09-22 ENCOUNTER — Encounter (INDEPENDENT_AMBULATORY_CARE_PROVIDER_SITE_OTHER): Payer: Self-pay | Admitting: Gastroenterology

## 2019-09-22 ENCOUNTER — Encounter (INDEPENDENT_AMBULATORY_CARE_PROVIDER_SITE_OTHER): Payer: Self-pay | Admitting: *Deleted

## 2019-09-22 VITALS — BP 159/100 | HR 74 | Temp 97.2°F | Ht 68.0 in | Wt 158.4 lb

## 2019-09-22 DIAGNOSIS — Z8371 Family history of colonic polyps: Secondary | ICD-10-CM

## 2019-09-22 DIAGNOSIS — R634 Abnormal weight loss: Secondary | ICD-10-CM | POA: Insufficient documentation

## 2019-09-22 DIAGNOSIS — R109 Unspecified abdominal pain: Secondary | ICD-10-CM

## 2019-09-22 DIAGNOSIS — R935 Abnormal findings on diagnostic imaging of other abdominal regions, including retroperitoneum: Secondary | ICD-10-CM | POA: Insufficient documentation

## 2019-09-22 DIAGNOSIS — D649 Anemia, unspecified: Secondary | ICD-10-CM

## 2019-09-22 DIAGNOSIS — R197 Diarrhea, unspecified: Secondary | ICD-10-CM

## 2019-09-22 LAB — COMPREHENSIVE METABOLIC PANEL
ALT: 14 U/L (ref 0–44)
AST: 16 U/L (ref 15–41)
Albumin: 4.3 g/dL (ref 3.5–5.0)
Alkaline Phosphatase: 55 U/L (ref 38–126)
Anion gap: 10 (ref 5–15)
BUN: 11 mg/dL (ref 6–20)
CO2: 21 mmol/L — ABNORMAL LOW (ref 22–32)
Calcium: 9.1 mg/dL (ref 8.9–10.3)
Chloride: 106 mmol/L (ref 98–111)
Creatinine, Ser: 0.66 mg/dL (ref 0.44–1.00)
GFR calc Af Amer: >60 mL/min (ref >60)
GFR calc non Af Amer: >60 mL/min (ref >60)
Glucose, Bld: 101 mg/dL — ABNORMAL HIGH (ref 70–99)
Potassium: 3.9 mmol/L (ref 3.5–5.1)
Sodium: 137 mmol/L (ref 135–145)
Total Bilirubin: 0.7 mg/dL (ref 0.3–1.2)
Total Protein: 8 g/dL (ref 6.5–8.1)

## 2019-09-22 LAB — CBC WITH DIFFERENTIAL/PLATELET
Abs Immature Granulocytes: 0.02 10*3/uL (ref 0.00–0.07)
Basophils Absolute: 0 10*3/uL (ref 0.0–0.1)
Basophils Relative: 0 %
Eosinophils Absolute: 0.1 10*3/uL (ref 0.0–0.5)
Eosinophils Relative: 1 %
HCT: 34.7 % — ABNORMAL LOW (ref 36.0–46.0)
Hemoglobin: 10.8 g/dL — ABNORMAL LOW (ref 12.0–15.0)
Immature Granulocytes: 0 %
Lymphocytes Relative: 30 %
Lymphs Abs: 1.7 10*3/uL (ref 0.7–4.0)
MCH: 26.4 pg (ref 26.0–34.0)
MCHC: 31.1 g/dL (ref 30.0–36.0)
MCV: 84.8 fL (ref 80.0–100.0)
Monocytes Absolute: 0.3 10*3/uL (ref 0.1–1.0)
Monocytes Relative: 6 %
Neutro Abs: 3.4 10*3/uL (ref 1.7–7.7)
Neutrophils Relative %: 63 %
Platelets: 287 10*3/uL (ref 150–400)
RBC: 4.09 MIL/uL (ref 3.87–5.11)
RDW: 15.3 % (ref 11.5–15.5)
WBC: 5.6 10*3/uL (ref 4.0–10.5)
nRBC: 0 % (ref 0.0–0.2)

## 2019-09-22 LAB — FERRITIN: Ferritin: 4 ng/mL — ABNORMAL LOW (ref 11–307)

## 2019-09-22 LAB — IRON AND TIBC
Iron: 38 ug/dL (ref 28–170)
Saturation Ratios: 8 % — ABNORMAL LOW (ref 10.4–31.8)
TIBC: 463 ug/dL — ABNORMAL HIGH (ref 250–450)
UIBC: 425 ug/dL

## 2019-09-22 MED ORDER — DICYCLOMINE HCL 10 MG PO CAPS: 10.0000 mg | ORAL_CAPSULE | Freq: Three times a day (TID) | ORAL | 1 refills | Status: DC | PRN

## 2019-09-22 NOTE — Patient Instructions (Signed)
We are checking labs today for evaluation-we are also scheduling endoscopy colonoscopy.  In the interim you can use dicyclomine as needed for abdominal pain and diarrhea

## 2019-09-22 NOTE — Progress Notes (Addendum)
Patient profile: Cathy Munoz is a 49 y.o. female seen for evaluation of abd pain and anemia.   History of Present Illness: Cathy Munoz is seen today for evaluation of abd pain -she reports approximately 6 months of symptoms including cramping, diarrhea, abdominal pain-reports she also has dizziness with the symptoms as well.  She is having looser stools with more urgency.  Typically has 2 loose stools in a day, occasionally will skip a day without a bowel movement followed by looser stools the next day.  She was seen in the ER in January 2021 for abd pain and diagnosed with kidney stones.  Symptoms continued and was seen additionally in March 2021 and diagnosed with enteritis which she was given Cipro for.  She reports it improved but did not resolve her symptoms. Continues to have abd cramping diffusely, frequently worsened by food.   She reports chronic nausea for "long time" - foods such as sesame seeds, fried foods of pizza make it worse.  She feels she tolerates grilled foods okay usually.  She has had an unintentional weight loss over the past year of about 15 pounds, she reports when her GI symptoms are the worst only eating foods such as applesauce and pudding. Denies vomiting. Uses zofran PRN. Denies any heartburn or dysphagia.   Seen in ER March 2021 for diarrhea abdominal pain and was given cipro and bentyl which helped "some" with abdominal pain but pt doesn't feel changed frequency of diarrhea.     Non smoker, occasional alcohol-last 6 months, no NSAIDS frequently, usually uses Tylenol   Wt Readings from Last 3 Encounters:  09/22/19 158 lb 6.4 oz (71.8 kg)  07/07/19 156 lb (70.8 kg)  10/09/17 171 lb (77.6 kg)     Last Colonoscopy: none prior Last Endoscopy: none prior    Past Medical History:  Past Medical History:  Diagnosis Date  . Cancer (HCC)    cervical  . Ovarian cyst    left  . Shingles 2004  . Thyroid disease    hypothyroidism  . Vaginal Pap smear,  abnormal    conization 1991    Problem List: There are no problems to display for this patient.   Past Surgical History: Past Surgical History:  Procedure Laterality Date  . CERVICAL CONE BIOPSY    . DILATION AND CURETTAGE OF UTERUS      Allergies: Allergies  Allergen Reactions  . Sulfa Antibiotics Anaphylaxis      Home Medications:  Current Outpatient Medications:  .  levothyroxine (SYNTHROID, LEVOTHROID) 112 MCG tablet, Take 112 mcg by mouth daily., Disp: , Rfl:  .  loratadine (CLARITIN) 10 MG tablet, Take 10 mg by mouth daily as needed for allergies. , Disp: , Rfl:  .  nystatin cream (MYCOSTATIN), Apply 1 application topically 2 (two) times daily as needed for dry skin. , Disp: , Rfl:  .  ondansetron (ZOFRAN ODT) 4 MG disintegrating tablet, 4mg  ODT q4 hours prn nausea/vomit, Disp: 10 tablet, Rfl: 0 .  dicyclomine (BENTYL) 10 MG capsule, Take 1 capsule (10 mg total) by mouth 3 (three) times daily as needed for spasms (diarrhea, abd pain)., Disp: 90 capsule, Rfl: 1   Family History: family history includes Abdominal Wall Hernia in her father; Anuerysm in her father; Asthma in her father; Cancer in her maternal uncle, paternal aunt, and sister; Diabetes in her father; Heart failure in her father; Hyperlipidemia in her father; Hypertension in her maternal aunt; Stroke in her mother.  Sister - "73 polyps" - age 82. Multiple maternal cousins and aunts w/ pancreatic cancer.    Social History:   reports that she has never smoked. She has never used smokeless tobacco. She reports that she does not drink alcohol or use drugs.   Review of Systems: Constitutional: Denies weight loss/weight gain  Eyes: No changes in vision. ENT: No oral lesions, sore throat.  GI: see HPI.  Heme/Lymph: No easy bruising.  CV: No chest pain.  GU: No hematuria.  Integumentary: No rashes.  Neuro: No headaches.  Psych: No depression/anxiety.  Endocrine: No heat/cold intolerance.    Allergic/Immunologic: No urticaria.  Resp: No cough, SOB.  Musculoskeletal: No joint swelling.    Physical Examination: BP (!) 159/100 (BP Location: Right Arm, Patient Position: Sitting, Cuff Size: Large)   Pulse 74   Temp (!) 97.2 F (36.2 C) (Temporal)   Ht 5\' 8"  (1.727 m)   Wt 158 lb 6.4 oz (71.8 kg)   BMI 24.08 kg/m  Gen: NAD, alert and oriented x 4 HEENT: PEERLA, EOMI, Neck: supple, no JVD Chest: CTA bilaterally, no wheezes, crackles, or other adventitious sounds CV: RRR, no m/g/c/r Abd: soft, NT, ND, +BS in all four quadrants; no HSM, guarding, ridigity, or rebound tenderness Ext: no edema, well perfused with 2+ pulses, Skin: no rash or lesions noted on observed skin Lymph: no noted LAD  Data Reviewed:  CT scan March 2021-mild enhancement small bowel left mid abdomen and left lower quadrant, findings not specific but may represent enteritis.  No bowel obstruction.  Pelvic ultrasound-April 2021-posterior fundal fibroid, otherwise normal  06/2019-labs show hemoglobin 10.4, MCV 87, BMP with calcium 8.8, otherwise normal  07/11/2019 labs-hemoglobin 10.1, MCV 87, calcium 5.4, otherwise BMP normal, lipase normal. Urine rare bacteria and moderate Hgb   Assessment/Plan: Ms. Godwin is a 49 y.o. female   1.  Abdominal pain/diarrhea-abdominal pelvic CT with enteritis as above, she reports having a course of Cipro that did not resolve symptoms.  She reports her primary tested for C. difficile which was negative, results requested.  We will also check GI PCR panel.  Needs colonoscopy for evaluation.  Would recommend random biopsies to exclude microscopic colitis.  We will give her dicyclomine to use as needed interim-she has used this in the past with some benefit.  2.  Nausea/weight loss-needs upper endoscopy for evaluation.  She has no GERD symptoms.  Denies new meds when symptoms began. She has zofran at home for PRN use   3.  Anemia-patient reports heme positive at PCP, stool  results requested.  Normocytic.  We will check iron studies for clarification.  Endoscopy/colonoscopy as above. Repeat labs as well to ensure anemia stable.   Patient denies CP, SOB, and use of blood thinners. I discussed the risks and benefits of procedure including bleeding, perforation, infection, missed lesions, medication reactions and possible hospitalization or surgery if complications. All questions answered.   Tamara was seen today for new patient (initial visit).  Diagnoses and all orders for this visit:  Anemia, unspecified type -     CBC with Differential -     Comprehensive Metabolic Panel (CMET) -     Fe+TIBC+Fer -     Gastrointestinal Panel by PCR , Stool  Abnormal CT of the abdomen -     CBC with Differential -     Comprehensive Metabolic Panel (CMET) -     Fe+TIBC+Fer -     Gastrointestinal Panel by PCR , Stool  Loss of weight -  CBC with Differential -     Comprehensive Metabolic Panel (CMET) -     Fe+TIBC+Fer -     Gastrointestinal Panel by PCR , Stool  Abdominal pain, unspecified abdominal location -     CBC with Differential -     Comprehensive Metabolic Panel (CMET) -     Fe+TIBC+Fer -     Gastrointestinal Panel by PCR , Stool  Diarrhea, unspecified type -     Gastrointestinal Panel by PCR , Stool  Family history of colonic polyps  Other orders -     dicyclomine (BENTYL) 10 MG capsule; Take 1 capsule (10 mg total) by mouth 3 (three) times daily as needed for spasms (diarrhea, abd pain).        I personally performed the service, non-incident to. (WP)  Laurine Blazer, [facility]

## 2019-09-25 ENCOUNTER — Other Ambulatory Visit (INDEPENDENT_AMBULATORY_CARE_PROVIDER_SITE_OTHER): Payer: Self-pay | Admitting: Gastroenterology

## 2019-09-25 MED ORDER — FERROUS SULFATE 325 (65 FE) MG PO TABS: 325.0000 mg | ORAL_TABLET | Freq: Every day | ORAL | 3 refills | Status: DC

## 2019-09-28 ENCOUNTER — Other Ambulatory Visit: Payer: Self-pay

## 2019-09-28 ENCOUNTER — Other Ambulatory Visit (HOSPITAL_COMMUNITY)
Admission: RE | Admit: 2019-09-28 | Discharge: 2019-09-28 | Disposition: A | Discharge status: Home / Self Care | Payer: PRIVATE HEALTH INSURANCE | Source: Ambulatory Visit | Attending: Gastroenterology | Admitting: Gastroenterology

## 2019-09-28 DIAGNOSIS — R197 Diarrhea, unspecified: Secondary | ICD-10-CM | POA: Insufficient documentation

## 2019-09-28 DIAGNOSIS — R935 Abnormal findings on diagnostic imaging of other abdominal regions, including retroperitoneum: Secondary | ICD-10-CM | POA: Insufficient documentation

## 2019-09-28 DIAGNOSIS — R634 Abnormal weight loss: Secondary | ICD-10-CM | POA: Insufficient documentation

## 2019-09-29 ENCOUNTER — Ambulatory Visit (HOSPITAL_COMMUNITY)
Admission: RE | Admit: 2019-09-29 | Discharge: 2019-09-29 | Disposition: A | Discharge status: Home / Self Care | Payer: PRIVATE HEALTH INSURANCE | Source: Ambulatory Visit | Attending: *Deleted | Admitting: *Deleted

## 2019-09-29 ENCOUNTER — Other Ambulatory Visit: Payer: Self-pay

## 2019-09-29 DIAGNOSIS — R928 Other abnormal and inconclusive findings on diagnostic imaging of breast: Secondary | ICD-10-CM | POA: Insufficient documentation

## 2019-09-29 LAB — GASTROINTESTINAL PANEL BY PCR, STOOL (REPLACES STOOL CULTURE)

## 2019-09-30 ENCOUNTER — Encounter (HOSPITAL_COMMUNITY): Payer: Self-pay | Admitting: Emergency Medicine

## 2019-09-30 ENCOUNTER — Other Ambulatory Visit: Payer: Self-pay

## 2019-09-30 ENCOUNTER — Emergency Department (HOSPITAL_COMMUNITY)
Admission: EM | Admit: 2019-09-30 | Discharge: 2019-09-30 | Disposition: A | Discharge status: Home / Self Care | Payer: Self-pay | Attending: Emergency Medicine | Admitting: Emergency Medicine

## 2019-09-30 DIAGNOSIS — L509 Urticaria, unspecified: Secondary | ICD-10-CM | POA: Insufficient documentation

## 2019-09-30 DIAGNOSIS — Z882 Allergy status to sulfonamides status: Secondary | ICD-10-CM | POA: Insufficient documentation

## 2019-09-30 MED ORDER — PREDNISONE 10 MG (21) PO TBPK: ORAL_TABLET | Freq: Every day | ORAL | 0 refills | Status: DC

## 2019-09-30 NOTE — ED Provider Notes (Signed)
[facility] EMERGENCY DEPARTMENT Provider Note   CSN: [id number] Arrival date & time: 09/30/19  1706     History Chief Complaint  Patient presents with  . Urticaria    Cathy Munoz is a 49 y.o. female.  49 year old female presents with complaint of rash to her abdomen and thighs.  Patient states symptoms started this morning, states that she has not felt well today, did have swelling of her lips this morning however this is completely resolved.  Patient reports recently starting Bentyl, also taking a new iron supplement, unsure if this is contributed to her rash today.  Denies changes in soaps or detergents, no other complaints or concerns.        Past Medical History:  Diagnosis Date  . Cancer (HCC)    cervical  . Ovarian cyst    left  . Shingles 2004  . Thyroid disease    hypothyroidism  . Vaginal Pap smear, abnormal    conization 1991    There are no problems to display for this patient.   Past Surgical History:  Procedure Laterality Date  . CERVICAL CONE BIOPSY    . DILATION AND CURETTAGE OF UTERUS       OB History    Gravida  2   Para  1   Term  1   Preterm      AB  1   Living  1     SAB  1   TAB      Ectopic      Multiple      Live Births              Family History  Problem Relation Age of Onset  . Heart failure Father   . Anuerysm Father   . Asthma Father   . Diabetes Father   . Abdominal Wall Hernia Father   . Hyperlipidemia Father   . Cancer Sister        kidney cancer  . Cancer Maternal Uncle        pancreatic  . Hypertension Maternal Aunt   . Cancer Paternal Aunt        breast  . Stroke Mother     Social History   Tobacco Use  . Smoking status: Never Smoker  . Smokeless tobacco: Never Used  Substance Use Topics  . Alcohol use: No  . Drug use: No    Home Medications Prior to Admission medications   Medication Sig Start Date End Date Taking? Authorizing Provider  acetaminophen (TYLENOL) 500 MG tablet  Take 500 mg by mouth every 6 (six) hours as needed for moderate pain or headache.    [provider]  dicyclomine (BENTYL) 10 MG capsule Take 1 capsule (10 mg total) by mouth 3 (three) times daily as needed for spasms (diarrhea, abd pain). 09/22/19   Minus Liberty, PA-C  ferrous sulfate 325 (65 FE) MG tablet Take 1 tablet (325 mg total) by mouth daily. 09/25/19 09/24/20  Minus Liberty, PA-C  levothyroxine (SYNTHROID, LEVOTHROID) 112 MCG tablet Take 112 mcg by mouth daily.    [provider]  loratadine (CLARITIN) 10 MG tablet Take 10 mg by mouth daily as needed for allergies.     [provider]  megestrol (MEGACE) 40 MG tablet Take 40-80 mg by mouth daily as needed (heavy bleeding).  08/07/19   [provider]  ondansetron (ZOFRAN ODT) 4 MG disintegrating tablet 4mg  ODT q4 hours prn nausea/vomit Patient taking differently: Take 4  mg by mouth every 4 (four) hours as needed for nausea or vomiting.  07/07/19   Jacqlyn Larsen, PA-C  predniSONE (STERAPRED UNI-PAK 21 TAB) 10 MG (21) TBPK tablet Take by mouth daily. Take 6 tabs by mouth daily  for 2 days, then 5 tabs for 2 days, then 4 tabs for 2 days, then 3 tabs for 2 days, 2 tabs for 2 days, then 1 tab by mouth daily for 2 days 09/30/19   Tacy Learn, PA-C    Allergies    Sulfa antibiotics  Review of Systems   Review of Systems  Constitutional: Negative for fever.  Respiratory: Negative for chest tightness and shortness of breath.   Gastrointestinal: Negative for nausea and vomiting.  Skin: Positive for rash. Negative for wound.  Allergic/Immunologic: Negative for immunocompromised state.  Neurological: Negative for weakness.  All other systems reviewed and are negative.   Physical Exam Updated Vital Signs BP (!) 142/107 (BP Location: Right Arm)   Pulse 69   Temp 98.1 F (36.7 C) (Oral)   Resp 18   Ht 5\' 8"  (1.727 m)   Wt 71 kg   LMP 09/01/2019   SpO2 99%   BMI 23.80 kg/m   Physical  Exam Vitals and nursing note reviewed.  Constitutional:      General: She is not in acute distress.    Appearance: She is well-developed. She is not diaphoretic.  HENT:     Head: Normocephalic and atraumatic.     Mouth/Throat:     Mouth: Mucous membranes are moist.  Pulmonary:     Effort: Pulmonary effort is normal.  Skin:    General: Skin is warm and dry.     Findings: Rash present.     Comments: Urticaria to trunk.  Neurological:     Mental Status: She is alert and oriented to person, place, and time.  Psychiatric:        Behavior: Behavior normal.     ED Results / Procedures / Treatments   Labs (all labs ordered are listed, but only abnormal results are displayed) Labs Reviewed - No data to display  EKG None  Radiology US BREAST LTD UNI RIGHT INC AXILLA  Result Date: 09/29/2019 CLINICAL DATA:  Screening recall for possible right breast asymmetry. EXAM: DIGITAL DIAGNOSTIC UNILATERAL RIGHT MAMMOGRAM WITH CAD AND TOMO RIGHT BREAST ULTRASOUND COMPARISON:  PREVIOUS EXAMS. ACR Breast Density Category c: The breast tissue is heterogeneously dense, which may obscure small masses. FINDINGS: Spot compression tomograms were performed over the upper-outer posterior right breast. The initially questioned possible left breast asymmetry demonstrates imaging features suggestive dense fibroglandular tissue. In this region, there are 2 oval circumscribed masses each measuring approximately 0.3 cm. Mammographic images were processed with CAD. Targeted ultrasound of the upper-outer quadrant of the right breast was performed. Extremely dense fibroglandular tissue is present in the upper-outer quadrant of the right breast. Numerous cysts and clusters of cysts are identified. A cyst at 10 o'clock 5 cm from nipple measures 0.6 x 0.3 x 0.5 cm. An additional cyst at 10 o'clock 6 cm from nipple measures 0.4 x 0.3 x 0 4 cm. These cysts correspond well with the small masses seen in the upper-outer right breast  at mammography. No suspicious masses or abnormality seen. IMPRESSION: Right breast cysts. There are no findings of malignancy in the right breast. RECOMMENDATION: Screening mammogram in one year.(Code:SM-B-01Y) I have discussed the findings and recommendations with the patient. If applicable, a reminder letter will be sent to  the patient regarding the next appointment. BI-RADS CATEGORY  2: Benign. Electronically Signed   By: Everlean Alstrom M.D.   On: 09/29/2019 11:10   MS DIGITAL DIAG TOMO UNI RIGHT  Result Date: 09/29/2019 CLINICAL DATA:  Screening recall for possible right breast asymmetry. EXAM: DIGITAL DIAGNOSTIC UNILATERAL RIGHT MAMMOGRAM WITH CAD AND TOMO RIGHT BREAST ULTRASOUND COMPARISON:  PREVIOUS EXAMS. ACR Breast Density Category c: The breast tissue is heterogeneously dense, which may obscure small masses. FINDINGS: Spot compression tomograms were performed over the upper-outer posterior right breast. The initially questioned possible left breast asymmetry demonstrates imaging features suggestive dense fibroglandular tissue. In this region, there are 2 oval circumscribed masses each measuring approximately 0.3 cm. Mammographic images were processed with CAD. Targeted ultrasound of the upper-outer quadrant of the right breast was performed. Extremely dense fibroglandular tissue is present in the upper-outer quadrant of the right breast. Numerous cysts and clusters of cysts are identified. A cyst at 10 o'clock 5 cm from nipple measures 0.6 x 0.3 x 0.5 cm. An additional cyst at 10 o'clock 6 cm from nipple measures 0.4 x 0.3 x 0 4 cm. These cysts correspond well with the small masses seen in the upper-outer right breast at mammography. No suspicious masses or abnormality seen. IMPRESSION: Right breast cysts. There are no findings of malignancy in the right breast. RECOMMENDATION: Screening mammogram in one year.(Code:SM-B-01Y) I have discussed the findings and recommendations with the patient. If  applicable, a reminder letter will be sent to the patient regarding the next appointment. BI-RADS CATEGORY  2: Benign. Electronically Signed   By: Everlean Alstrom M.D.   On: 09/29/2019 11:10    Procedures Procedures (including critical care time)  Medications Ordered in ED Medications - No data to display  ED Course  I have reviewed the triage vital signs and the nursing notes.  Pertinent labs & imaging results that were available during my care of the patient were reviewed by me and considered in my medical decision making (see chart for details).  Clinical Course as of Sep 30 1830  Wed Sep 30, 7775  5586 49 year old female with rash onset today after starting the medications this week.  She does take an iron pill as well as Bentyl.  Patient woke up feeling unwell with swelling to her lip.  Swelling has resolved, no breathing difficulty or wheezing.  Patient is a daily thing for her symptoms.  On exam has dry skin with urticaria to her trunk. Recommend patient discontinue both new medications, can take Benadryl as needed as directed and will give Dosepak of prednisone.  Recommend follow-up with PCP, return to ER for worsening or concerning symptoms.   [LM]    Clinical Course User Index [LM] Roque Lias   MDM Rules/Calculators/A&P                      Final Clinical Impression(s) / ED Diagnoses Final diagnoses:  Urticaria    Rx / DC Orders ED Discharge Orders         Ordered    predniSONE (STERAPRED UNI-PAK 21 TAB) 10 MG (21) TBPK tablet  Daily     09/30/19 1829           Tacy Learn, PA-C 09/30/19 1832    Elnora Morrison, MD 09/30/19 2351

## 2019-09-30 NOTE — ED Triage Notes (Signed)
Pt reports recently started taking iron pillls. Pt reports lip was swollen this am. Pt reports that went away and now reports generalized itching and hives noted to abdomen. Airway patent. nad noted.

## 2019-09-30 NOTE — Discharge Instructions (Addendum)
You can apply an unscented sensitive skin lotion such as Eucerin or CeraVe day. Take prednisone as prescribed and complete the full course. You may take Benadryl as needed as directed.  Discontinue both of your new medications and contact your PCP.

## 2019-10-05 ENCOUNTER — Other Ambulatory Visit (HOSPITAL_COMMUNITY)
Admission: RE | Admit: 2019-10-05 | Discharge: 2019-10-05 | Disposition: A | Discharge status: Home / Self Care | Payer: Medicaid Other | Source: Ambulatory Visit | Attending: Internal Medicine | Admitting: Internal Medicine

## 2019-10-05 ENCOUNTER — Other Ambulatory Visit: Payer: Self-pay

## 2019-10-05 DIAGNOSIS — Z01812 Encounter for preprocedural laboratory examination: Secondary | ICD-10-CM | POA: Insufficient documentation

## 2019-10-05 DIAGNOSIS — Z20822 Contact with and (suspected) exposure to covid-19: Secondary | ICD-10-CM | POA: Diagnosis not present

## 2019-10-06 LAB — SARS CORONAVIRUS 2 (TAT 6-24 HRS): SARS Coronavirus 2: NEGATIVE

## 2019-10-07 ENCOUNTER — Ambulatory Visit (HOSPITAL_COMMUNITY)
Admission: RE | Admit: 2019-10-07 | Discharge: 2019-10-07 | Disposition: A | Discharge status: Home / Self Care | Payer: Self-pay | Attending: Internal Medicine | Admitting: Internal Medicine

## 2019-10-07 ENCOUNTER — Encounter (HOSPITAL_COMMUNITY): Payer: Self-pay | Admitting: Internal Medicine

## 2019-10-07 ENCOUNTER — Other Ambulatory Visit: Payer: Self-pay

## 2019-10-07 ENCOUNTER — Encounter (HOSPITAL_COMMUNITY)
Admission: RE | Disposition: A | Discharge status: Home / Self Care | Payer: Self-pay | Source: Home / Self Care | Attending: Internal Medicine

## 2019-10-07 DIAGNOSIS — Z823 Family history of stroke: Secondary | ICD-10-CM | POA: Insufficient documentation

## 2019-10-07 DIAGNOSIS — R634 Abnormal weight loss: Secondary | ICD-10-CM

## 2019-10-07 DIAGNOSIS — K644 Residual hemorrhoidal skin tags: Secondary | ICD-10-CM | POA: Insufficient documentation

## 2019-10-07 DIAGNOSIS — D649 Anemia, unspecified: Secondary | ICD-10-CM

## 2019-10-07 DIAGNOSIS — K449 Diaphragmatic hernia without obstruction or gangrene: Secondary | ICD-10-CM

## 2019-10-07 DIAGNOSIS — Z825 Family history of asthma and other chronic lower respiratory diseases: Secondary | ICD-10-CM | POA: Insufficient documentation

## 2019-10-07 DIAGNOSIS — K6289 Other specified diseases of anus and rectum: Secondary | ICD-10-CM

## 2019-10-07 DIAGNOSIS — Z87442 Personal history of urinary calculi: Secondary | ICD-10-CM | POA: Insufficient documentation

## 2019-10-07 DIAGNOSIS — Z8349 Family history of other endocrine, nutritional and metabolic diseases: Secondary | ICD-10-CM | POA: Insufficient documentation

## 2019-10-07 DIAGNOSIS — Z8541 Personal history of malignant neoplasm of cervix uteri: Secondary | ICD-10-CM | POA: Insufficient documentation

## 2019-10-07 DIAGNOSIS — Z803 Family history of malignant neoplasm of breast: Secondary | ICD-10-CM | POA: Insufficient documentation

## 2019-10-07 DIAGNOSIS — Z882 Allergy status to sulfonamides status: Secondary | ICD-10-CM | POA: Insufficient documentation

## 2019-10-07 DIAGNOSIS — R935 Abnormal findings on diagnostic imaging of other abdominal regions, including retroperitoneum: Secondary | ICD-10-CM

## 2019-10-07 DIAGNOSIS — Z8051 Family history of malignant neoplasm of kidney: Secondary | ICD-10-CM | POA: Insufficient documentation

## 2019-10-07 DIAGNOSIS — Z8249 Family history of ischemic heart disease and other diseases of the circulatory system: Secondary | ICD-10-CM | POA: Insufficient documentation

## 2019-10-07 DIAGNOSIS — Z833 Family history of diabetes mellitus: Secondary | ICD-10-CM | POA: Insufficient documentation

## 2019-10-07 DIAGNOSIS — R197 Diarrhea, unspecified: Secondary | ICD-10-CM

## 2019-10-07 DIAGNOSIS — K529 Noninfective gastroenteritis and colitis, unspecified: Secondary | ICD-10-CM | POA: Insufficient documentation

## 2019-10-07 DIAGNOSIS — R58 Hemorrhage, not elsewhere classified: Secondary | ICD-10-CM

## 2019-10-07 DIAGNOSIS — Z79899 Other long term (current) drug therapy: Secondary | ICD-10-CM | POA: Insufficient documentation

## 2019-10-07 DIAGNOSIS — R109 Unspecified abdominal pain: Secondary | ICD-10-CM

## 2019-10-07 DIAGNOSIS — E039 Hypothyroidism, unspecified: Secondary | ICD-10-CM | POA: Insufficient documentation

## 2019-10-07 DIAGNOSIS — D500 Iron deficiency anemia secondary to blood loss (chronic): Secondary | ICD-10-CM

## 2019-10-07 DIAGNOSIS — Z800 Family history of malignant neoplasm of digestive organs: Secondary | ICD-10-CM | POA: Insufficient documentation

## 2019-10-07 DIAGNOSIS — R110 Nausea: Secondary | ICD-10-CM

## 2019-10-07 HISTORY — PX: ESOPHAGOGASTRODUODENOSCOPY: SHX5428

## 2019-10-07 HISTORY — DX: Hypothyroidism, unspecified: E03.9

## 2019-10-07 HISTORY — PX: BIOPSY: SHX5522

## 2019-10-07 HISTORY — DX: Personal history of urinary calculi: Z87.442

## 2019-10-07 HISTORY — PX: COLONOSCOPY: SHX5424

## 2019-10-07 SURGERY — COLONOSCOPY
Anesthesia: Moderate Sedation

## 2019-10-07 MED ORDER — MIDAZOLAM HCL 5 MG/5ML IJ SOLN
INTRAMUSCULAR | Status: AC
  Filled 2019-10-07: qty 5

## 2019-10-07 MED ORDER — SODIUM CHLORIDE 0.9 % IV SOLN
INTRAVENOUS | Status: DC
  Administered 2019-10-07: 1000 mL via INTRAVENOUS

## 2019-10-07 MED ORDER — MIDAZOLAM HCL 5 MG/5ML IJ SOLN
INTRAMUSCULAR | Status: AC
  Filled 2019-10-07: qty 10

## 2019-10-07 MED ORDER — STERILE WATER FOR IRRIGATION IR SOLN
Status: DC | PRN
  Administered 2019-10-07: 1.5 mL

## 2019-10-07 MED ORDER — MEPERIDINE HCL 50 MG/ML IJ SOLN
INTRAMUSCULAR | Status: DC | PRN
  Administered 2019-10-07 (×4): 25 mg via INTRAVENOUS

## 2019-10-07 MED ORDER — MEPERIDINE HCL 50 MG/ML IJ SOLN
INTRAMUSCULAR | Status: AC
  Filled 2019-10-07: qty 1

## 2019-10-07 MED ORDER — LIDOCAINE VISCOUS HCL 2 % MT SOLN
OROMUCOSAL | Status: AC
  Filled 2019-10-07: qty 15

## 2019-10-07 MED ORDER — MIDAZOLAM HCL 5 MG/5ML IJ SOLN
INTRAMUSCULAR | Status: DC | PRN
  Administered 2019-10-07: 3 mg via INTRAVENOUS
  Administered 2019-10-07 (×2): 2 mg via INTRAVENOUS
  Administered 2019-10-07: 1 mg via INTRAVENOUS
  Administered 2019-10-07: 2 mg via INTRAVENOUS

## 2019-10-07 MED ORDER — LIDOCAINE VISCOUS HCL 2 % MT SOLN
OROMUCOSAL | Status: DC | PRN
  Administered 2019-10-07: 1 via OROMUCOSAL

## 2019-10-07 NOTE — Op Note (Signed)
[facility] Patient Name: Cathy Munoz Procedure Date: 10/07/2019 12:15 PM MRN: [id number] Date of Birth: 08-Jul-1970 Attending MD: Hildred Laser , MD CSN: [id number] Age: 49 Admit Type: Outpatient Procedure:                Upper GI endoscopy Indications:              Suspected upper gastrointestinal bleeding in                            patient with chronic blood loss, Nausea Providers:                Hildred Laser, MD, Charlsie Quest. Theda Sers RN, RN, Aram Candela Referring MD:             Karma Ganja, NP Medicines:                Lidocaine spray, Meperidine 75 mg IV, Midazolam 9                            mg IV Complications:            No immediate complications. Estimated Blood Loss:     Estimated blood loss was minimal. Procedure:                Pre-Anesthesia Assessment:                           - Prior to the procedure, a History and Physical                            was performed, and patient medications and                            allergies were reviewed. The patient's tolerance of                            previous anesthesia was also reviewed. The risks                            and benefits of the procedure and the sedation                            options and risks were discussed with the patient.                            All questions were answered, and informed consent                            was obtained. Prior Anticoagulants: The patient has                            taken no previous anticoagulant or antiplatelet  agents. ASA Grade Assessment: II - A patient with                            mild systemic disease. After reviewing the risks                            and benefits, the patient was deemed in                            satisfactory condition to undergo the procedure.                           After obtaining informed consent, the endoscope was                            passed under direct  vision. Throughout the                            procedure, the patient's blood pressure, pulse, and                            oxygen saturations were monitored continuously. The                            [id number]-[id number] ([id number]) scope was introduced through the                            mouth, and advanced to the second part of duodenum.                            The upper GI endoscopy was accomplished without                            difficulty. The patient tolerated the procedure                            well. Scope In: 1:26:27 PM Scope Out: 1:34:13 PM Total Procedure Duration: 0 hours 7 minutes 46 seconds  Findings:      The hypopharynx was normal.      The examined esophagus was normal.      The Z-line was regular and was found 36 cm from the incisors.      A 3 cm hiatal hernia was present.      The entire examined stomach was normal.      The duodenal bulb and second portion of the duodenum were normal.       Biopsies for histology were taken with a cold forceps for evaluation of       celiac disease. The pathology specimen was placed into Bottle Number 1. Impression:               - Normal hypopharynx.                           - Normal esophagus.                           -  Z-line regular, 36 cm from the incisors.                           - 3 cm hiatal hernia.                           - Normal stomach.                           - Normal duodenal bulb and second portion of the                            duodenum. Biopsied. Moderate Sedation:      Moderate (conscious) sedation was administered by the endoscopy nurse       and supervised by the endoscopist. The following parameters were       monitored: oxygen saturation, heart rate, blood pressure, CO2       capnography and response to care. Total physician intraservice time was       19 minutes. Recommendation:           - Patient has a contact number available for                            emergencies. The signs and  symptoms of potential                            delayed complications were discussed with the                            patient. Return to normal activities tomorrow.                            Written discharge instructions were provided to the                            patient.                           - Resume previous diet today.                           - Continue present medications.                           - No aspirin, ibuprofen, naproxen, or other                            non-steroidal anti-inflammatory drugs.                           - Await pathology results.                           - See the other procedure note for documentation of                            additional recommendations.  Procedure Code(s):        --- Professional ---                           [phone], Esophagogastroduodenoscopy, flexible,                            transoral; with biopsy, single or multiple                           G0500, Moderate sedation services provided by the                            same physician or other qualified health care                            professional performing a gastrointestinal                            endoscopic service that sedation supports,                            requiring the presence of an independent trained                            observer to assist in the monitoring of the                            patient's level of consciousness and physiological                            status; initial 15 minutes of intra-service time;                            patient age 77 years or older (additional time may                            be reported with [phone], as appropriate) Diagnosis Code(s):        --- Professional ---                           K44.9, Diaphragmatic hernia without obstruction or                            gangrene                           R58, Hemorrhage, not elsewhere classified                           R11.0, Nausea CPT  copyright 2019 American Medical Association. All rights reserved. The codes documented in this report are preliminary and upon coder review may  be revised to meet current compliance requirements. Hildred Laser, MD Hildred Laser, MD 10/07/2019 2:18:58 PM This report has been signed electronically. Number of Addenda: 0

## 2019-10-07 NOTE — Op Note (Signed)
[facility] Patient Name: Cathy Munoz Procedure Date: 10/07/2019 1:35 PM MRN: [id number] Date of Birth: Jan 31, 1971 Attending MD: Hildred Laser , MD CSN: [id number] Age: 49 Admit Type: Outpatient Procedure:                Colonoscopy Indications:              Chronic diarrhea, Iron deficiency anemia secondary                            to chronic blood loss Providers:                Hildred Laser, MD, Charlsie Quest. Theda Sers RN, RN, Aram Candela Referring MD:             Karma Ganja, NP Medicines:                Meperidine 25 mg IV, Midazolam 1 mg IV Complications:            No immediate complications. Estimated Blood Loss:     Estimated blood loss was minimal. Procedure:                Pre-Anesthesia Assessment:                           - Prior to the procedure, a History and Physical                            was performed, and patient medications and                            allergies were reviewed. The patient's tolerance of                            previous anesthesia was also reviewed. The risks                            and benefits of the procedure and the sedation                            options and risks were discussed with the patient.                            All questions were answered, and informed consent                            was obtained. Prior Anticoagulants: The patient has                            taken no previous anticoagulant or antiplatelet                            agents. ASA Grade Assessment: II - A patient with  mild systemic disease. After reviewing the risks                            and benefits, the patient was deemed in                            satisfactory condition to undergo the procedure.                           After obtaining informed consent, the colonoscope                            was passed under direct vision. Throughout the                            procedure,  the patient's blood pressure, pulse, and                            oxygen saturations were monitored continuously. The                            [id number]-[id number] ([id number]) scope was introduced through                            the anus and advanced to the the terminal ileum,                            with identification of the appendiceal orifice and                            IC valve. The colonoscopy was performed without                            difficulty. The patient tolerated the procedure                            well. The quality of the bowel preparation was                            good. The terminal ileum, ileocecal valve,                            appendiceal orifice, and rectum were photographed. Scope In: 1:40:59 PM Scope Out: 2:05:24 PM Scope Withdrawal Time: 0 hours 11 minutes 14 seconds  Total Procedure Duration: 0 hours 24 minutes 25 seconds  Findings:      The perianal and digital rectal examinations were normal.      The terminal ileum appeared normal.      The colon (entire examined portion) appeared normal.      Biopsies for histology were taken with a cold forceps from the right       colon and sigmoid colon for evaluation of microscopic colitis. The       pathology specimen was placed into Bottle Number 2.      External hemorrhoids were found during  retroflexion. The hemorrhoids       were small.      Anal papilla(e) were hypertrophied. Impression:               - The examined portion of the ileum was normal.                           - The entire examined colon is normal.                           - External hemorrhoids.                           - Anal papilla(e) were hypertrophied.                           - Biopsies were taken with a cold forceps from the                            right colon and sigmoid colon for evaluation of                            microscopic colitis. Moderate Sedation:      Moderate (conscious) sedation was administered by the  endoscopy nurse       and supervised by the endoscopist. The following parameters were       monitored: oxygen saturation, heart rate, blood pressure, CO2       capnography and response to care. Total physician intraservice time was       23 minutes. Recommendation:           - Patient has a contact number available for                            emergencies. The signs and symptoms of potential                            delayed complications were discussed with the                            patient. Return to normal activities tomorrow.                            Written discharge instructions were provided to the                            patient.                           - Resume previous diet today.                           - Continue present medications.                           - No aspirin, ibuprofen, naproxen, or other  non-steroidal anti-inflammatory drugs.                           - Await pathology results.                           - Repeat colonoscopy in 10 years for screening                            purposes. Procedure Code(s):        --- Professional ---                           [phone], Colonoscopy, flexible; with biopsy, single                            or multiple                           99153, Moderate sedation; each additional 15                            minutes intraservice time                           G0500, Moderate sedation services provided by the                            same physician or other qualified health care                            professional performing a gastrointestinal                            endoscopic service that sedation supports,                            requiring the presence of an independent trained                            observer to assist in the monitoring of the                            patient's level of consciousness and physiological                            status; initial 15  minutes of intra-service time;                            patient age 19 years or older (additional time may                            be reported with [phone], as appropriate) Diagnosis Code(s):        --- Professional ---  K64.4, Residual hemorrhoidal skin tags                           K62.89, Other specified diseases of anus and rectum                           K52.9, Noninfective gastroenteritis and colitis,                            unspecified                           D50.0, Iron deficiency anemia secondary to blood                            loss (chronic) CPT copyright 2019 American Medical Association. All rights reserved. The codes documented in this report are preliminary and upon coder review may  be revised to meet current compliance requirements. Hildred Laser, MD Hildred Laser, MD 10/07/2019 2:23:46 PM This report has been signed electronically. Number of Addenda: 0

## 2019-10-07 NOTE — Discharge Instructions (Signed)
No aspirin or NSAIDs for 24 hours. Resume usual medications as before Resume usual diet. No driving for 24 hours. Physician will call with biopsy results.       Upper Endoscopy, Adult, Care After This sheet gives you information about how to care for yourself after your procedure. Your health care provider may also give you more specific instructions. If you have problems or questions, contact your health care provider. What can I expect after the procedure? After the procedure, it is common to have:  A sore throat.  Mild stomach pain or discomfort.  Bloating.  Nausea. Follow these instructions at home:   Follow instructions from your health care provider about what to eat or drink after your procedure.  Return to your normal activities as told by your health care provider. Ask your health care provider what activities are safe for you.  Take over-the-counter and prescription medicines only as told by your health care provider.  Do not drive for 24 hours if you were given a sedative during your procedure.  Keep all follow-up visits as told by your health care provider. This is important. Contact a health care provider if you have:  A sore throat that lasts longer than one day.  Trouble swallowing. Get help right away if:  You vomit blood or your vomit looks like coffee grounds.  You have: ? A fever. ? Bloody, black, or tarry stools. ? A severe sore throat or you cannot swallow. ? Difficulty breathing. ? Severe pain in your chest or abdomen. Summary  After the procedure, it is common to have a sore throat, mild stomach discomfort, bloating, and nausea.  Do not drive for 24 hours if you were given a sedative during the procedure.  Follow instructions from your health care provider about what to eat or drink after your procedure.  Return to your normal activities as told by your health care provider. This information is not intended to replace advice given to  you by your health care provider. Make sure you discuss any questions you have with your health care provider. Document Revised: 10/01/2017 Document Reviewed: 09/09/2017 Elsevier Patient Education  [place].    Colonoscopy, Adult, Care After This sheet gives you information about how to care for yourself after your procedure. Your doctor may also give you more specific instructions. If you have problems or questions, call your doctor. What can I expect after the procedure? After the procedure, it is common to have:  A small amount of blood in your poop (stool) for 24 hours.  Some gas.  Mild cramping or bloating in your belly (abdomen). Follow these instructions at home: Eating and drinking   Drink enough fluid to keep your pee (urine) pale yellow.  Follow instructions from your doctor about what you cannot eat or drink.  Return to your normal diet as told by your doctor. Avoid heavy or fried foods that are hard to digest. Activity  Rest as told by your doctor.  Do not sit for a long time without moving. Get up to take short walks every 1-2 hours. This is important. Ask for help if you feel weak or unsteady.  Return to your normal activities as told by your doctor. Ask your doctor what activities are safe for you. To help cramping and bloating:   Try walking around.  Put heat on your belly as told by your doctor. Use the heat source that your doctor recommends, such as a moist heat pack or a  heating pad. ? Put a towel between your skin and the heat source. ? Leave the heat on for 20-30 minutes. ? Remove the heat if your skin turns bright red. This is very important if you are unable to feel pain, heat, or cold. You may have a greater risk of getting burned. General instructions  For the first 24 hours after the procedure: ? Do not drive or use machinery. ? Do not sign important documents. ? Do not drink alcohol. ? Do your daily activities more slowly than  normal. ? Eat foods that are soft and easy to digest.  Take over-the-counter or prescription medicines only as told by your doctor.  Keep all follow-up visits as told by your doctor. This is important. Contact a doctor if:  You have blood in your poop 2-3 days after the procedure. Get help right away if:  You have more than a small amount of blood in your poop.  You see large clumps of tissue (blood clots) in your poop.  Your belly is swollen.  You feel like you may vomit (nauseous).  You vomit.  You have a fever.  You have belly pain that gets worse, and medicine does not help your pain. Summary  After the procedure, it is common to have a small amount of blood in your poop. You may also have mild cramping and bloating in your belly.  For the first 24 hours after the procedure, do not drive or use machinery, do not sign important documents, and do not drink alcohol.  Get help right away if you have a lot of blood in your poop, feel like you may vomit, have a fever, or have more belly pain. This information is not intended to replace advice given to you by your health care provider. Make sure you discuss any questions you have with your health care provider. Document Revised: 11/03/2018 Document Reviewed: 11/03/2018 Elsevier Patient Education  [place].

## 2019-10-07 NOTE — H&P (Signed)
Cathy Munoz is an 49 y.o. female.   Chief Complaint: Patient is here for esophagogastroduodenoscopy and colonoscopy. HPI: Patient is 49 year old Caucasian female who presents with 6 months history of diarrhea abdominal cramping rectal bleeding 20 pound weight loss since January 2021.  She also has nausea but no vomiting.  Her appetite is fair but she is afraid to eat certain foods that triggers cramping and diarrhea.  Stool testing for C. difficile and GI pathogen panel negative.  She had CT back in March 2021 revealed some enhancement wall thickening to small bowel loops. She remains with intermittent menses.  Last episode was 1 month ago.  At times she passes large amount of blood. Work-up reveals iron deficiency anemia. Only history is negative for celiac disease, inflammatory bowel disease or CRC.  Past Medical History:  Diagnosis Date  . Cancer ([place])    cervical  . History of kidney stones   . Hypothyroidism   . Ovarian cyst    left  . Shingles 2004  . Thyroid disease    hypothyroidism  . Vaginal Pap smear, abnormal    conization 1991    Past Surgical History:  Procedure Laterality Date  . CERVICAL CONE BIOPSY    . DILATION AND CURETTAGE OF UTERUS      Family History  Problem Relation Age of Onset  . Heart failure Father   . Anuerysm Father   . Asthma Father   . Diabetes Father   . Abdominal Wall Hernia Father   . Hyperlipidemia Father   . Cancer Sister        kidney cancer  . Cancer Maternal Uncle        pancreatic  . Hypertension Maternal Aunt   . Cancer Paternal Aunt        breast  . Stroke Mother    Social History:  reports that she has never smoked. She has never used smokeless tobacco. She reports that she does not drink alcohol and does not use drugs.  Allergies:  Allergies  Allergen Reactions  . Sulfa Antibiotics Anaphylaxis  . Ferrous Bisglycinate Chelate [Iron] Rash    Top and bottom lip swelling    Medications Prior to Admission  Medication  Sig Dispense Refill  . acetaminophen (TYLENOL) 500 MG tablet Take 500 mg by mouth every 6 (six) hours as needed for moderate pain or headache.    . dicyclomine (BENTYL) 10 MG capsule Take 1 capsule (10 mg total) by mouth 3 (three) times daily as needed for spasms (diarrhea, abd pain). 90 capsule 1  . diphenhydrAMINE (BENADRYL) 50 MG capsule Take 50 mg by mouth every 6 (six) hours as needed.    Marland Kitchen levothyroxine (SYNTHROID, LEVOTHROID) 112 MCG tablet Take 112 mcg by mouth daily.    . megestrol (MEGACE) 40 MG tablet Take 40-80 mg by mouth daily as needed (heavy bleeding).     . ferrous sulfate 325 (65 FE) MG tablet Take 1 tablet (325 mg total) by mouth daily. 30 tablet 3  . loratadine (CLARITIN) 10 MG tablet Take 10 mg by mouth daily as needed for allergies.     Marland Kitchen ondansetron (ZOFRAN ODT) 4 MG disintegrating tablet 4mg  ODT q4 hours prn nausea/vomit (Patient taking differently: Take 4 mg by mouth every 4 (four) hours as needed for nausea or vomiting. ) 10 tablet 0  . predniSONE (STERAPRED UNI-PAK 21 TAB) 10 MG (21) TBPK tablet Take by mouth daily. Take 6 tabs by mouth daily  for 2 days, then 5 tabs  for 2 days, then 4 tabs for 2 days, then 3 tabs for 2 days, 2 tabs for 2 days, then 1 tab by mouth daily for 2 days 42 tablet 0    Results for orders placed or performed during the hospital encounter of 10/05/19 (from the past 48 hour(s))  SARS CORONAVIRUS 2 (TAT 6-24 HRS) Nasopharyngeal Nasopharyngeal Swab     Status: None   Collection Time: 10/05/19  2:30 PM   Specimen: Nasopharyngeal Swab  Result Value Ref Range   SARS Coronavirus 2 NEGATIVE NEGATIVE    Comment: (NOTE) SARS-CoV-2 target nucleic acids are NOT DETECTED.  The SARS-CoV-2 RNA is generally detectable in upper and lower respiratory specimens during the acute phase of infection. Negative results do not preclude SARS-CoV-2 infection, do not rule out co-infections with other pathogens, and should not be used as the sole basis for treatment or  other patient management decisions. Negative results must be combined with clinical observations, patient history, and epidemiological information. The expected result is Negative.  Fact Sheet for Patients: SugarRoll.be  Fact Sheet for Healthcare Providers: https://www.woods-mathews.com/  This test is not yet approved or cleared by the [place] FDA and  has been authorized for detection and/or diagnosis of SARS-CoV-2 by FDA under an Emergency Use Authorization (EUA). This EUA will remain  in effect (meaning this test can be used) for the duration of the COVID-19 declaration under Se ction 564(b)(1) of the Act, 21 U.S.C. section 360bbb-3(b)(1), unless the authorization is terminated or revoked sooner.  Performed at [facility], [place] [address] [place], [place] [address]    No results found.  Review of Systems  Blood pressure (!) 146/92, pulse 76, temperature 98.7 F (37.1 C), temperature source Oral, resp. rate 15, height 5' 8.5" (1.74 m), weight 68.5 kg, last menstrual period 08/29/2019, SpO2 100 %. Physical Exam  HENT:  Mouth/Throat: Mucous membranes are moist. Oropharynx is clear.  Eyes: Conjunctivae are normal. No scleral icterus.  Cardiovascular: Normal rate, regular rhythm and normal heart sounds.  No murmur heard. Respiratory: Effort normal and breath sounds normal.  GI:  Symmetrical soft nontender with organomegaly or masses.  Musculoskeletal:        General: No swelling.     Cervical back: Normal range of motion.  Lymphadenopathy:    She has no cervical adenopathy.  Neurological: She is alert.  Skin: Skin is warm and dry.  Psychiatric: Her behavior is normal.     Assessment/Plan Nausea diarrhea rectal bleeding and weight loss. Iron deficiency anemia. Diagnostic esophagogastroduodenoscopy and colonoscopy.   Hildred Laser, MD 10/07/2019, 1:14 PM

## 2019-10-09 ENCOUNTER — Encounter (HOSPITAL_COMMUNITY): Payer: Self-pay | Admitting: Internal Medicine

## 2019-10-09 LAB — SURGICAL PATHOLOGY

## 2019-11-16 ENCOUNTER — Ambulatory Visit (INDEPENDENT_AMBULATORY_CARE_PROVIDER_SITE_OTHER): Payer: Self-pay | Admitting: Gastroenterology

## 2019-12-23 ENCOUNTER — Ambulatory Visit (INDEPENDENT_AMBULATORY_CARE_PROVIDER_SITE_OTHER): Payer: Medicaid Other | Admitting: Gastroenterology

## 2020-01-06 ENCOUNTER — Encounter (INDEPENDENT_AMBULATORY_CARE_PROVIDER_SITE_OTHER): Payer: Self-pay | Admitting: Gastroenterology

## 2020-01-06 ENCOUNTER — Other Ambulatory Visit (HOSPITAL_COMMUNITY)
Admission: RE | Admit: 2020-01-06 | Discharge: 2020-01-06 | Disposition: A | Discharge status: Home / Self Care | Payer: Medicaid Other | Source: Ambulatory Visit | Attending: Gastroenterology | Admitting: Gastroenterology

## 2020-01-06 ENCOUNTER — Ambulatory Visit (INDEPENDENT_AMBULATORY_CARE_PROVIDER_SITE_OTHER): Payer: Medicaid Other | Admitting: Gastroenterology

## 2020-01-06 ENCOUNTER — Other Ambulatory Visit: Payer: Self-pay

## 2020-01-06 VITALS — BP 135/87 | HR 92 | Temp 99.4°F | Ht 68.0 in | Wt 160.0 lb

## 2020-01-06 DIAGNOSIS — D509 Iron deficiency anemia, unspecified: Secondary | ICD-10-CM

## 2020-01-06 DIAGNOSIS — R300 Dysuria: Secondary | ICD-10-CM

## 2020-01-06 LAB — CBC WITH DIFFERENTIAL/PLATELET
Abs Immature Granulocytes: 0.02 10*3/uL (ref 0.00–0.07)
Basophils Absolute: 0 10*3/uL (ref 0.0–0.1)
Basophils Relative: 1 %
Eosinophils Absolute: 0.1 10*3/uL (ref 0.0–0.5)
Eosinophils Relative: 1 %
HCT: 33.9 % — ABNORMAL LOW (ref 36.0–46.0)
Hemoglobin: 10.5 g/dL — ABNORMAL LOW (ref 12.0–15.0)
Immature Granulocytes: 0 %
Lymphocytes Relative: 26 %
Lymphs Abs: 1.7 10*3/uL (ref 0.7–4.0)
MCH: 26.9 pg (ref 26.0–34.0)
MCHC: 31 g/dL (ref 30.0–36.0)
MCV: 86.9 fL (ref 80.0–100.0)
Monocytes Absolute: 0.5 10*3/uL (ref 0.1–1.0)
Monocytes Relative: 8 %
Neutro Abs: 4 10*3/uL (ref 1.7–7.7)
Neutrophils Relative %: 64 %
Platelets: 301 10*3/uL (ref 150–400)
RBC: 3.9 MIL/uL (ref 3.87–5.11)
RDW: 14.7 % (ref 11.5–15.5)
WBC: 6.3 10*3/uL (ref 4.0–10.5)
nRBC: 0 % (ref 0.0–0.2)

## 2020-01-06 LAB — URINALYSIS, ROUTINE W REFLEX MICROSCOPIC
Glucose, UA: NEGATIVE mg/dL
Ketones, ur: NEGATIVE mg/dL
Leukocytes,Ua: NEGATIVE
Nitrite: NEGATIVE
Protein, ur: 100 mg/dL — AB
Specific Gravity, Urine: 1.029 (ref 1.005–1.030)
pH: 6 (ref 5.0–8.0)

## 2020-01-06 LAB — FERRITIN: Ferritin: 4 ng/mL — ABNORMAL LOW (ref 11–307)

## 2020-01-06 LAB — IRON: Iron: 35 ug/dL (ref 28–170)

## 2020-01-06 MED ORDER — DOCUSATE SODIUM 100 MG PO CAPS: 100.0000 mg | ORAL_CAPSULE | Freq: Every day | ORAL | 3 refills | Status: DC

## 2020-01-06 NOTE — Progress Notes (Signed)
Patient profile: Cathy Munoz is a 50 y.o. female seen for follow-up.  She was last seen June 2021 for abdominal pain and anemia.  She had an EGD and colonoscopy as below.  She had previously had an ER visit in March 2021 with a CT scan showing enteritis.  C. difficile and GI PCR panels were negative.  EGD and colonoscopy are overall unremarkable.  She was started on Bentyl after visit.  Seen today for follow-up.  History of Present Illness: Cathy Munoz is seen today for f/up. The diarrhea she had at her last visit has completely resolved. At this point she tends to be slightly more constipated.  Has a stool about every day or every other day.  Does report some issues with some intermittent right flank pain over the past 6 weeks-does not relate to eating.  Proximal discomfort in her right flank is intermittently sharp.  States it can always seen like prior symptoms of passing a kidney stone or UTI.  She also notes discomfort improves with defecation, seems worse if she misses days between stools.  Previously was using dicyclomine 3 times daily but is now using fairly infrequently once a week given diarrhea has resolved.  The dicyclomine does not affect her right flank pain.  Very rare GERD symptoms that resolved with Tums as needed.  Denies nausea, vomiting, dysphagia, epigastric pain  She does not use NSAIDs.  She was not able to tolerate the oral iron supplement given at initial visit-she had allergic reaction and had to be seen in emergency room given prednisone.   Wt Readings from Last 3 Encounters:  01/06/20 160 lb (72.6 kg)  10/07/19 151 lb (68.5 kg)  09/30/19 156 lb 8.4 oz (71 kg)     Last Colonoscopy: 09/2019-The examined portion of the ileum was normal. - The entire examined colon is normal. - External hemorrhoids. - Anal papilla(e) were hypertrophied. - Biopsies were taken with a cold forceps from the right colon and sigmoid colon for evaluation of microscopic colitis.   Last  Endoscopy: 09/2019- Normal hypopharynx. - Normal esophagus. - Z-line regular, 36 cm from the incisors. - 3 cm hiatal hernia. - Normal stomach. - Normal duodenal bulb and second portion of the duodenum. Biopsied.   PATH- Biopsy results reviewed with patient Duodenal biopsy is negative for celiac disease and colonic biopsy negative for microscopic or collagenous colitis. Patient feels dicyclomine is helping. Patient had CT which suggested focal small bowel thickening. This may or may not need follow-up depending on her clinical course. Patient advised to take dicyclomine on schedule whether that is twice a day or 3 times a day. Patient advised to keep symptom and stool diary until office visit in 8 weeks.     Past Medical History:  Past Medical History:  Diagnosis Date  . Anemia   . Cancer ([place])    cervical  . History of kidney stones   . Hypothyroidism   . Ovarian cyst    left  . Shingles 2004  . Thyroid disease    hypothyroidism  . Vaginal Pap smear, abnormal    conization 1991    Problem List: There are no problems to display for this patient.   Past Surgical History: Past Surgical History:  Procedure Laterality Date  . BIOPSY  10/07/2019   Procedure: BIOPSY;  Surgeon: Rogene Houston, MD;  [facility]: [facility];  Service: Endoscopy;;  duodenal/random colon  . CERVICAL CONE BIOPSY    . COLONOSCOPY N/A 10/07/2019  Procedure: COLONOSCOPY;  Surgeon: Rogene Houston, MD;  Location: [facility];  Service: Endoscopy;  Laterality: N/A;  1:10  . DILATION AND CURETTAGE OF UTERUS    . ESOPHAGOGASTRODUODENOSCOPY N/A 10/07/2019   Procedure: ESOPHAGOGASTRODUODENOSCOPY (EGD);  Surgeon: Rogene Houston, MD;  Location: [facility];  Service: Endoscopy;  Laterality: N/A;    Allergies: Allergies  Allergen Reactions  . Sulfa Antibiotics Anaphylaxis  . Ferrous Bisglycinate Chelate [Iron] Rash    Top and bottom lip swelling      Home Medications:  Current Outpatient  Medications:  .  acetaminophen (TYLENOL) 500 MG tablet, Take 500 mg by mouth every 6 (six) hours as needed for moderate pain or headache., Disp: , Rfl:  .  dicyclomine (BENTYL) 10 MG capsule, Take 1 capsule (10 mg total) by mouth 3 (three) times daily as needed for spasms (diarrhea, abd pain)., Disp: 90 capsule, Rfl: 1 .  diphenhydrAMINE (BENADRYL) 50 MG capsule, Take 50 mg by mouth every 6 (six) hours as needed., Disp: , Rfl:  .  levothyroxine (SYNTHROID, LEVOTHROID) 112 MCG tablet, Take 112 mcg by mouth daily., Disp: , Rfl:  .  megestrol (MEGACE) 40 MG tablet, Take 40-80 mg by mouth daily as needed (heavy bleeding). , Disp: , Rfl:  .  ondansetron (ZOFRAN ODT) 4 MG disintegrating tablet, 4mg  ODT q4 hours prn nausea/vomit (Patient taking differently: Take 4 mg by mouth every 4 (four) hours as needed for nausea or vomiting. ), Disp: 10 tablet, Rfl: 0 .  loratadine (CLARITIN) 10 MG tablet, Take 10 mg by mouth daily as needed for allergies.  (Patient not taking: Reported on 01/06/2020), Disp: , Rfl:    Family History: family history includes Abdominal Wall Hernia in her father; Anuerysm in her father; Asthma in her father; Cancer in her maternal uncle, paternal aunt, and sister; Diabetes in her father; Heart failure in her father; Hyperlipidemia in her father; Hypertension in her maternal aunt; Stroke in her mother.    Social History:   reports that she has never smoked. She has never used smokeless tobacco. She reports that she does not drink alcohol and does not use drugs.   Review of Systems: Constitutional: Denies weight loss/weight gain  Eyes: No changes in vision. ENT: No oral lesions, sore throat.  GI: see HPI.  Heme/Lymph: No easy bruising.  CV: No chest pain.  GU: No hematuria.  Integumentary: No rashes.  Neuro: No headaches.  Psych: No depression/anxiety.  Endocrine: No heat/cold intolerance.  Allergic/Immunologic: No urticaria.  Resp: No cough, SOB.  Musculoskeletal: No joint  swelling.    Physical Examination: BP 135/87 (BP Location: Right Arm, Patient Position: Sitting, Cuff Size: Small)   Pulse 92   Temp 99.4 F (37.4 C) (Oral)   Ht 5\' 8"  (1.727 m)   Wt 160 lb (72.6 kg)   BMI 24.33 kg/m  Gen: NAD, alert and oriented x 4 HEENT: PEERLA, EOMI, Neck: supple, no JVD Chest: CTA bilaterally, no wheezes, crackles, or other adventitious sounds CV: RRR, no m/g/c/r Abd: soft, mild TTP right flank, no RLQ TTP, ND, +BS in all four quadrants; no HSM, guarding, ridigity, or rebound tenderness Ext: no edema, well perfused with 2+ pulses, Skin: no rash or lesions noted on observed skin Lymph: no noted LAD  Data Reviewed:  09/2019--Hgb 10.8, MCV 84, CMP normal. Ferritin 4. Iron 38.   Assessment/Plan: Ms. Tison is a 49 y.o. female    Kamyra was seen today for anemia.  Diagnoses and all orders for  this visit:  Iron deficiency anemia, unspecified iron deficiency anemia type -     Cancel: CBC with Differential -     Fe+TIBC+Fer -     CBC with Differential -     Cancel: Ferritin -     Cancel: Iron -     Ferritin -     Iron  Dysuria -     Cancel: Urinalysis, Routine w reflex microscopic -     Cancel: Urine Culture -     Urine Culture -     Urinalysis, Routine w reflex microscopic    1. IDA-significant iron deficiency with hemoglobin 10.8 and ferritin of 4 in 2021.  She denies any obvious GI bleeding.  She was unable to tolerate oral iron supplement and had a reaction leading to ER visit and beginning prednisone.  We will repeat her labs today.  If still low will need to consider IV iron and possible capsule endoscopy.  Her colonoscopy and endoscopy did not reveal source of GI bleed.  2.  Dysuria with right flank pain-feels symptoms seem similar to kidney stone UTI.  Check UA and urine culture.  3.  Diarrhea-resolved.  Uses dicyclomine as needed for abdominal pain, last use 1 dose 1 week ago.  Now actually having mild constipation and will start stool  softener.   She has can [place] and will have labs done at [facility] lab.     I personally performed the service, non-incident to. (WP)  Laurine Blazer, [facility]

## 2020-01-06 NOTE — Patient Instructions (Signed)
We are starting stool softener - hopefully this will move bowels daily. We are also checking urine lab.

## 2020-01-07 LAB — URINE CULTURE

## 2020-01-12 ENCOUNTER — Other Ambulatory Visit (INDEPENDENT_AMBULATORY_CARE_PROVIDER_SITE_OTHER): Payer: Self-pay | Admitting: Gastroenterology

## 2020-01-12 ENCOUNTER — Other Ambulatory Visit (INDEPENDENT_AMBULATORY_CARE_PROVIDER_SITE_OTHER): Payer: Self-pay

## 2020-01-12 DIAGNOSIS — R300 Dysuria: Secondary | ICD-10-CM

## 2020-01-12 MED ORDER — FLINTSTONES W/IRON 18 MG PO CHEW: 1.0000 | CHEWABLE_TABLET | Freq: Every day | ORAL | 0 refills | Status: DC

## 2020-01-12 NOTE — Progress Notes (Signed)
Urine culture w/ multiple species - new order placed

## 2020-05-18 ENCOUNTER — Other Ambulatory Visit: Payer: Self-pay

## 2020-05-18 ENCOUNTER — Ambulatory Visit: Payer: Commercial Managed Care - PPO | Admitting: Podiatry

## 2020-07-01 ENCOUNTER — Encounter (HOSPITAL_COMMUNITY): Payer: Self-pay | Admitting: *Deleted

## 2020-07-01 ENCOUNTER — Other Ambulatory Visit: Payer: Self-pay

## 2020-07-01 ENCOUNTER — Emergency Department (HOSPITAL_COMMUNITY)
Admission: EM | Admit: 2020-07-01 | Discharge: 2020-07-01 | Disposition: A | Discharge status: Home / Self Care | Payer: Self-pay | Attending: Emergency Medicine | Admitting: Emergency Medicine

## 2020-07-01 ENCOUNTER — Emergency Department (HOSPITAL_COMMUNITY): Payer: Self-pay

## 2020-07-01 DIAGNOSIS — D259 Leiomyoma of uterus, unspecified: Secondary | ICD-10-CM | POA: Insufficient documentation

## 2020-07-01 DIAGNOSIS — Z79899 Other long term (current) drug therapy: Secondary | ICD-10-CM | POA: Insufficient documentation

## 2020-07-01 DIAGNOSIS — E039 Hypothyroidism, unspecified: Secondary | ICD-10-CM | POA: Insufficient documentation

## 2020-07-01 DIAGNOSIS — N938 Other specified abnormal uterine and vaginal bleeding: Secondary | ICD-10-CM | POA: Insufficient documentation

## 2020-07-01 DIAGNOSIS — Z8541 Personal history of malignant neoplasm of cervix uteri: Secondary | ICD-10-CM | POA: Insufficient documentation

## 2020-07-01 DIAGNOSIS — R102 Pelvic and perineal pain: Secondary | ICD-10-CM | POA: Insufficient documentation

## 2020-07-01 LAB — CBC
HCT: 32.1 % — ABNORMAL LOW (ref 36.0–46.0)
Hemoglobin: 10.1 g/dL — ABNORMAL LOW (ref 12.0–15.0)
MCH: 27.3 pg (ref 26.0–34.0)
MCHC: 31.5 g/dL (ref 30.0–36.0)
MCV: 86.8 fL (ref 80.0–100.0)
Platelets: 300 10*3/uL (ref 150–400)
RBC: 3.7 MIL/uL — ABNORMAL LOW (ref 3.87–5.11)
RDW: 15.7 % — ABNORMAL HIGH (ref 11.5–15.5)
WBC: 6.9 10*3/uL (ref 4.0–10.5)
nRBC: 0 % (ref 0.0–0.2)

## 2020-07-01 LAB — BASIC METABOLIC PANEL
Anion gap: 10 (ref 5–15)
BUN: 14 mg/dL (ref 6–20)
CO2: 23 mmol/L (ref 22–32)
Calcium: 8.3 mg/dL — ABNORMAL LOW (ref 8.9–10.3)
Chloride: 107 mmol/L (ref 98–111)
Creatinine, Ser: 0.56 mg/dL (ref 0.44–1.00)
GFR, Estimated: >60 mL/min (ref >60)
Glucose, Bld: 94 mg/dL (ref 70–99)
Potassium: 3.6 mmol/L (ref 3.5–5.1)
Sodium: 140 mmol/L (ref 135–145)

## 2020-07-01 MED ORDER — KETOROLAC TROMETHAMINE 60 MG/2ML IM SOLN
60.0000 mg | Freq: Once | INTRAMUSCULAR | Status: AC
  Administered 2020-07-01: 60 mg via INTRAMUSCULAR
  Filled 2020-07-01: qty 2

## 2020-07-01 MED ORDER — MEGESTROL ACETATE 40 MG PO TABS: 40.0000 mg | ORAL_TABLET | Freq: Every day | ORAL | 0 refills | Status: DC

## 2020-07-01 NOTE — ED Notes (Signed)
Pt reports had normal menstrual period on 3/1 that lasted approx 5 days.  Reports started having heavy vaginal bleeding again on 3//9.  C/o lower abd cramping.

## 2020-07-01 NOTE — Discharge Instructions (Addendum)
Your ultrasound shows that your fibroid is slightly larger than last evaluation and may be the source of your heavy bleeding. Take the prescription to help slow down and stop your bleeding.  Call your primary MD or Dr. Elonda Husky for a recheck of your symptoms as discussed.

## 2020-07-01 NOTE — ED Triage Notes (Signed)
Vaginal bleeding with clots onset 06/21/20

## 2020-07-01 NOTE — ED Provider Notes (Signed)
[facility] EMERGENCY DEPARTMENT Provider Note   CSN: [id number] Arrival date & time: 07/01/20  1230     History Chief Complaint  Patient presents with   Vaginal Bleeding    Cathy Munoz is a 50 y.o. female with a history as outlined below, most significant for history of cervical cancer status post conization in 1991, history of ovarian cyst, known uterine fibroids and prior problems with menorrhagia presenting with a 11-day history of heavy vaginal bleeding including passage of clots.  She does endorse low pelvic cramping, she denies abdominal pain, nausea or vomiting also denies history of vaginal discharge or urinary complaints.  She has been on Megace for this problem in the past but has not had this medication for the past year.  She was offered a Mirena IUD to help better control these heavy periods by both her pcp and Dr. Elonda Husky but has declined this treatment.  She denies dizziness or weakness.       The history is provided by the patient.       Past Medical History:  Diagnosis Date   Anemia    Cancer ([place])    cervical   History of kidney stones    Hypothyroidism    Ovarian cyst    left   Shingles 2004   Thyroid disease    hypothyroidism   Vaginal Pap smear, abnormal    conization 1991    There are no problems to display for this patient.   Past Surgical History:  Procedure Laterality Date   BIOPSY  10/07/2019   Procedure: BIOPSY;  Surgeon: Rogene Houston, MD;  [facility];  Service: Endoscopy;;  duodenal/random colon   CERVICAL CONE BIOPSY     COLONOSCOPY N/A 10/07/2019   Procedure: COLONOSCOPY;  Surgeon: Rogene Houston, MD;  [facility];  Service: Endoscopy;  Laterality: N/A;  1:10   DILATION AND CURETTAGE OF UTERUS     ESOPHAGOGASTRODUODENOSCOPY N/A 10/07/2019   Procedure: ESOPHAGOGASTRODUODENOSCOPY (EGD);  Surgeon: Rogene Houston, MD;  [facility];  Service: Endoscopy;  Laterality: N/A;     OB  History    Gravida  2   Para  1   Term  1   Preterm      AB  1   Living  1     SAB  1   IAB      Ectopic      Multiple      Live Births              Family History  Problem Relation Age of Onset   Heart failure Father    Anuerysm Father    Asthma Father    Diabetes Father    Abdominal Wall Hernia Father    Hyperlipidemia Father    Cancer Sister        kidney cancer   Cancer Maternal Uncle        pancreatic   Hypertension Maternal Aunt    Cancer Paternal Aunt        breast   Stroke Mother     Social History   Tobacco Use   Smoking status: Never Smoker   Smokeless tobacco: Never Used  Vaping Use   Vaping Use: Never used  Substance Use Topics   Alcohol use: No   Drug use: No    Home Medications Prior to Admission medications   Medication Sig Start Date End Date Taking? Authorizing Provider  megestrol (MEGACE) 40  MG tablet Take 1 tablet (40 mg total) by mouth daily. 07/01/20  Yes Suvan Stcyr, Almyra Free, PA-C  acetaminophen (TYLENOL) 500 MG tablet Take 500 mg by mouth every 6 (six) hours as needed for moderate pain or headache.    [provider]  dicyclomine (BENTYL) 10 MG capsule Take 1 capsule (10 mg total) by mouth 3 (three) times daily as needed for spasms (diarrhea, abd pain). 09/22/19   Ezzard Standing, PA-C  diphenhydrAMINE (BENADRYL) 50 MG capsule Take 50 mg by mouth every 6 (six) hours as needed.    [provider]  docusate sodium (COLACE) 100 MG capsule Take 1 capsule (100 mg total) by mouth daily. 01/06/20   Ezzard Standing, PA-C  levothyroxine (SYNTHROID, LEVOTHROID) 112 MCG tablet Take 112 mcg by mouth daily.    [provider]  loratadine (CLARITIN) 10 MG tablet Take 10 mg by mouth daily as needed for allergies.  Patient not taking: Reported on 01/06/2020    [provider]  ondansetron (ZOFRAN ODT) 4 MG disintegrating tablet 4mg  ODT q4 hours prn nausea/vomit Patient taking differently: Take 4 mg  by mouth every 4 (four) hours as needed for nausea or vomiting.  07/07/19   Jacqlyn Larsen, PA-C  Pediatric Multivitamins-Iron Vicki Mallet W/IRON) 18 MG CHEW Chew 1 tablet by mouth daily. 01/12/20   Ezzard Standing, PA-C    Allergies    Sulfa antibiotics and Ferrous bisglycinate chelate [iron]  Review of Systems   Review of Systems  Constitutional: Negative for chills and fever.  HENT: Negative for congestion and sore throat.   Eyes: Negative.   Respiratory: Negative for chest tightness and shortness of breath.   Cardiovascular: Negative for chest pain.  Gastrointestinal: Negative for abdominal pain, nausea and vomiting.  Genitourinary: Positive for vaginal bleeding. Negative for dysuria.  Musculoskeletal: Negative for arthralgias, joint swelling and neck pain.  Skin: Negative.  Negative for rash and wound.  Neurological: Negative for dizziness, weakness, light-headedness, numbness and headaches.  Psychiatric/Behavioral: Negative.   All other systems reviewed and are negative.   Physical Exam Updated Vital Signs BP (!) 145/96 (BP Location: Left Arm)    Pulse 79    Temp 98.7 F (37.1 C) (Oral)    Resp 20    SpO2 100%   Physical Exam Vitals and nursing note reviewed.  Constitutional:      Appearance: She is well-developed.  HENT:     Head: Normocephalic and atraumatic.  Eyes:     Conjunctiva/sclera: Conjunctivae normal.  Cardiovascular:     Rate and Rhythm: Normal rate and regular rhythm.     Heart sounds: Normal heart sounds.  Pulmonary:     Effort: Pulmonary effort is normal.     Breath sounds: Normal breath sounds. No wheezing.  Abdominal:     General: Bowel sounds are normal.     Palpations: Abdomen is soft.     Tenderness: There is no abdominal tenderness. There is no guarding.  Genitourinary:    Comments: deferred Musculoskeletal:        General: Normal range of motion.     Cervical back: Normal range of motion.  Skin:    General: Skin is warm and dry.   Neurological:     General: No focal deficit present.     Mental Status: She is alert.     ED Results / Procedures / Treatments   Labs (all labs ordered are listed, but only abnormal results are displayed) Labs Reviewed  CBC - Abnormal; Notable  for the following components:      Result Value   RBC 3.70 (*)    Hemoglobin 10.1 (*)    HCT 32.1 (*)    RDW 15.7 (*)    All other components within normal limits  BASIC METABOLIC PANEL - Abnormal; Notable for the following components:   Calcium 8.3 (*)    All other components within normal limits    EKG None  Radiology US PELVIC COMPLETE WITH TRANSVAGINAL  Result Date: 07/01/2020 CLINICAL DATA:  Pelvic pain.  History of fibroids. EXAM: TRANSABDOMINAL AND TRANSVAGINAL ULTRASOUND OF PELVIS TECHNIQUE: Both transabdominal and transvaginal ultrasound examinations of the pelvis were performed. Transabdominal technique was performed for global imaging of the pelvis including uterus, ovaries, adnexal regions, and pelvic cul-de-sac. It was necessary to proceed with endovaginal exam following the transabdominal exam to visualize the uterus and adnexa. COMPARISON:  Pelvic ultrasound 08/10/2018 FINDINGS: Uterus Measurements: 9.0 x 5.0 x 7.0 cm = volume: 162 mL. Uterus is retroverted. Posterior fundal fibroid measures 3.5 x 3.3 x 3.0 cm, slightly increased in size from prior exam. There is some shadowing which may indicate calcifications. No internal vascularity. This may be submucosal in location. No additional or new fibroids. Endometrium Thickness: Thin measuring 1-2 mm.  No focal abnormality visualized. Right ovary Measurements: 2.4 x 1.8 x 1.8 cm = volume: 3.9 mL. Normal appearance without cyst or solid mass. Blood flow is noted. No adnexal mass. Left ovary Measurements: 3.5 x 2.2 x 2.6 cm = volume: 10.6 mL. There is a 2 cm follicular cyst. Blood flow is noted. No solid mass. No adnexal mass. Other findings No abnormal free fluid. IMPRESSION: 1. Fundal  fibroid measuring 3.5 cm maximal dimension has mildly increased in size from prior ultrasound. 2. Thin endometrium. 3. Follicular cyst in the left ovary measuring 2 cm which is likely physiologic. No dedicated imaging follow-up is needed. Right ovary appears normal. Electronically Signed   By: Keith Rake M.D.   On: 07/01/2020 16:38    Procedures Procedures   Medications Ordered in ED Medications  ketorolac (TORADOL) injection 60 mg (60 mg Intramuscular Given 07/01/20 1538)    ED Course  I have reviewed the triage vital signs and the nursing notes.  Pertinent labs & imaging results that were available during my care of the patient were reviewed by me and considered in my medical decision making (see chart for details).    MDM Rules/Calculators/A&P                          Labs and [place] reviewed and stable, pt with chronic anemia, not worsened today, slight increased fibroid size compared to prior imaging.  She endorses hives from prior attempts to take oral iron supplementation, so not added today.  She was started on megace for control of her dub.  Encouraged close f/u with pcp and/or Dr. Elonda Husky for further management if sx persist or worsen.  Pt understands and agrees with plan.  Final Clinical Impression(s) / ED Diagnoses Final diagnoses:  Dysfunctional uterine bleeding  Uterine leiomyoma, unspecified location    Rx / DC Orders ED Discharge Orders         Ordered    megestrol (MEGACE) 40 MG tablet  Daily        07/01/20 1728           Evalee Jefferson, PA-C 07/01/20 2358    Fredia Sorrow, MD 07/12/20 [phone]

## 2020-07-04 ENCOUNTER — Other Ambulatory Visit: Payer: Self-pay

## 2020-07-04 ENCOUNTER — Telehealth (INDEPENDENT_AMBULATORY_CARE_PROVIDER_SITE_OTHER): Payer: Medicaid Other | Admitting: Gastroenterology

## 2020-07-04 ENCOUNTER — Telehealth (INDEPENDENT_AMBULATORY_CARE_PROVIDER_SITE_OTHER): Payer: Self-pay

## 2020-07-04 ENCOUNTER — Ambulatory Visit (INDEPENDENT_AMBULATORY_CARE_PROVIDER_SITE_OTHER): Payer: Medicaid Other | Admitting: Gastroenterology

## 2020-07-04 NOTE — Telephone Encounter (Signed)
Patient no showed for her appointment on 07/04/2020 at [facility] with Dr. Maylon Peppers.

## 2020-07-04 NOTE — Progress Notes (Unsigned)
No show

## 2020-07-04 NOTE — Telephone Encounter (Signed)
Noted  

## 2020-07-05 ENCOUNTER — Encounter (INDEPENDENT_AMBULATORY_CARE_PROVIDER_SITE_OTHER): Payer: Self-pay | Admitting: Gastroenterology

## 2020-08-17 ENCOUNTER — Other Ambulatory Visit (HOSPITAL_COMMUNITY): Payer: Self-pay | Admitting: Nurse Practitioner

## 2020-08-17 DIAGNOSIS — Z1231 Encounter for screening mammogram for malignant neoplasm of breast: Secondary | ICD-10-CM

## 2020-09-15 ENCOUNTER — Telehealth: Payer: Self-pay

## 2020-09-20 ENCOUNTER — Encounter: Payer: Medicaid Other | Admitting: Obstetrics & Gynecology

## 2020-10-03 ENCOUNTER — Encounter (HOSPITAL_COMMUNITY): Payer: Self-pay

## 2020-10-03 ENCOUNTER — Ambulatory Visit (HOSPITAL_COMMUNITY): Payer: Self-pay

## 2020-12-11 ENCOUNTER — Emergency Department (HOSPITAL_COMMUNITY): Payer: Medicaid Other

## 2020-12-11 ENCOUNTER — Other Ambulatory Visit: Payer: Self-pay

## 2020-12-11 ENCOUNTER — Emergency Department (HOSPITAL_COMMUNITY)
Admission: EM | Admit: 2020-12-11 | Discharge: 2020-12-11 | Disposition: A | Discharge status: Home / Self Care | Payer: Medicaid Other | Attending: Emergency Medicine | Admitting: Emergency Medicine

## 2020-12-11 ENCOUNTER — Encounter (HOSPITAL_COMMUNITY): Payer: Self-pay | Admitting: Emergency Medicine

## 2020-12-11 DIAGNOSIS — Z79899 Other long term (current) drug therapy: Secondary | ICD-10-CM | POA: Insufficient documentation

## 2020-12-11 DIAGNOSIS — Z8541 Personal history of malignant neoplasm of cervix uteri: Secondary | ICD-10-CM | POA: Insufficient documentation

## 2020-12-11 DIAGNOSIS — R946 Abnormal results of thyroid function studies: Secondary | ICD-10-CM | POA: Insufficient documentation

## 2020-12-11 DIAGNOSIS — R7989 Other specified abnormal findings of blood chemistry: Secondary | ICD-10-CM

## 2020-12-11 DIAGNOSIS — R002 Palpitations: Secondary | ICD-10-CM

## 2020-12-11 DIAGNOSIS — F1410 Cocaine abuse, uncomplicated: Secondary | ICD-10-CM | POA: Insufficient documentation

## 2020-12-11 DIAGNOSIS — E860 Dehydration: Secondary | ICD-10-CM | POA: Insufficient documentation

## 2020-12-11 DIAGNOSIS — E039 Hypothyroidism, unspecified: Secondary | ICD-10-CM | POA: Insufficient documentation

## 2020-12-11 LAB — TROPONIN I (HIGH SENSITIVITY)
Troponin I (High Sensitivity): 28 ng/L — ABNORMAL HIGH (ref <18)
Troponin I (High Sensitivity): 22 ng/L — ABNORMAL HIGH (ref <18)

## 2020-12-11 LAB — CBC WITH DIFFERENTIAL/PLATELET
Abs Immature Granulocytes: 0.06 10*3/uL (ref 0.00–0.07)
Basophils Absolute: 0.1 10*3/uL (ref 0.0–0.1)
Basophils Relative: 1 %
Eosinophils Absolute: 0.1 10*3/uL (ref 0.0–0.5)
Eosinophils Relative: 1 %
HCT: 28.4 % — ABNORMAL LOW (ref 36.0–46.0)
Hemoglobin: 8.6 g/dL — ABNORMAL LOW (ref 12.0–15.0)
Immature Granulocytes: 1 %
Lymphocytes Relative: 16 %
Lymphs Abs: 1.5 10*3/uL (ref 0.7–4.0)
MCH: 23.2 pg — ABNORMAL LOW (ref 26.0–34.0)
MCHC: 30.3 g/dL (ref 30.0–36.0)
MCV: 76.5 fL — ABNORMAL LOW (ref 80.0–100.0)
Monocytes Absolute: 0.7 10*3/uL (ref 0.1–1.0)
Monocytes Relative: 8 %
Neutro Abs: 7.3 10*3/uL (ref 1.7–7.7)
Neutrophils Relative %: 73 %
Platelets: 484 10*3/uL — ABNORMAL HIGH (ref 150–400)
RBC: 3.71 MIL/uL — ABNORMAL LOW (ref 3.87–5.11)
RDW: 19.9 % — ABNORMAL HIGH (ref 11.5–15.5)
WBC: 9.8 10*3/uL (ref 4.0–10.5)
nRBC: 0 % (ref 0.0–0.2)

## 2020-12-11 LAB — BASIC METABOLIC PANEL
Anion gap: 10 (ref 5–15)
BUN: 11 mg/dL (ref 6–20)
CO2: 25 mmol/L (ref 22–32)
Calcium: 8.8 mg/dL — ABNORMAL LOW (ref 8.9–10.3)
Chloride: 99 mmol/L (ref 98–111)
Creatinine, Ser: 1.14 mg/dL — ABNORMAL HIGH (ref 0.44–1.00)
GFR, Estimated: 58 mL/min — ABNORMAL LOW (ref >60)
Glucose, Bld: 100 mg/dL — ABNORMAL HIGH (ref 70–99)
Potassium: 3.4 mmol/L — ABNORMAL LOW (ref 3.5–5.1)
Sodium: 134 mmol/L — ABNORMAL LOW (ref 135–145)

## 2020-12-11 LAB — TSH: TSH: 56.902 u[IU]/mL — ABNORMAL HIGH (ref 0.350–4.500)

## 2020-12-11 MED ORDER — SODIUM CHLORIDE 0.9 % IV BOLUS
1000.0000 mL | Freq: Once | INTRAVENOUS | Status: AC
  Administered 2020-12-11: 1000 mL via INTRAVENOUS

## 2020-12-11 NOTE — Discharge Instructions (Addendum)
You were seen today for palpitations in the setting of cocaine abuse.  Your heart testing did show some stress on the heart.  However, this appears limited.  You need to discontinue using crack cocaine as it can kill you.  It is a stimulant.  Your thyroid levels do indicate that you are probably under dosed on her thyroid medication.  Follow-up with your primary doctor for readjustment of your medications.  Make sure you are staying hydrated.

## 2020-12-11 NOTE — ED Provider Notes (Signed)
[facility] EMERGENCY DEPARTMENT Provider Note   CSN: [id number] Arrival date & time: 12/11/20  0150     History Chief Complaint  Patient presents with   Palpitations    Cathy Munoz is a 50 y.o. female.  HPI     This is a 50 year old female with a history of hypothyroidism and cervical cancer who presents with palpitations.  Patient reports she has been smoking crack cocaine continuously for greater than 24 hours.  She reports for the last 2 months she has been smoking very frequently.  She reports increased stressors at home and "I know it is a bad decision."  Denies any other alcohol or drug use.  Tonight she had onset of a racing heart.  No chest pain or shortness of breath.  States that she has bilateral foot numbness; however this is not new for her and she has been dealing with this for some time.  No weakness.  Currently she is asymptomatic.  She smoked just prior to arrival.  Past Medical History:  Diagnosis Date   Anemia    Cancer ([place])    cervical   History of kidney stones    Hypothyroidism    Ovarian cyst    left   Shingles 2004   Thyroid disease    hypothyroidism   Vaginal Pap smear, abnormal    conization 1991    There are no problems to display for this patient.   Past Surgical History:  Procedure Laterality Date   BIOPSY  10/07/2019   Procedure: BIOPSY;  Surgeon: Rogene Houston, MD;  [facility];  Service: Endoscopy;;  duodenal/random colon   CERVICAL CONE BIOPSY     COLONOSCOPY N/A 10/07/2019   Procedure: COLONOSCOPY;  Surgeon: Rogene Houston, MD;  [facility];  Service: Endoscopy;  Laterality: N/A;  1:10   DILATION AND CURETTAGE OF UTERUS     ESOPHAGOGASTRODUODENOSCOPY N/A 10/07/2019   Procedure: ESOPHAGOGASTRODUODENOSCOPY (EGD);  Surgeon: Rogene Houston, MD;  [facility];  Service: Endoscopy;  Laterality: N/A;     OB History     Gravida  2   Para  1   Term  1   Preterm      AB  1   Living  1       SAB  1   IAB      Ectopic      Multiple      Live Births              Family History  Problem Relation Age of Onset   Heart failure Father    Anuerysm Father    Asthma Father    Diabetes Father    Abdominal Wall Hernia Father    Hyperlipidemia Father    Cancer Sister        kidney cancer   Cancer Maternal Uncle        pancreatic   Hypertension Maternal Aunt    Cancer Paternal Aunt        breast   Stroke Mother     Social History   Tobacco Use   Smoking status: Never   Smokeless tobacco: Never  Vaping Use   Vaping Use: Never used  Substance Use Topics   Alcohol use: No   Drug use: No    Home Medications Prior to Admission medications   Medication Sig Start Date End Date Taking? Authorizing Provider  acetaminophen (TYLENOL) 500 MG tablet Take 500 mg by mouth  every 6 (six) hours as needed for moderate pain or headache.    [provider]  dicyclomine (BENTYL) 10 MG capsule Take 1 capsule (10 mg total) by mouth 3 (three) times daily as needed for spasms (diarrhea, abd pain). 09/22/19   Ezzard Standing, PA-C  diphenhydrAMINE (BENADRYL) 50 MG capsule Take 50 mg by mouth every 6 (six) hours as needed.    [provider]  docusate sodium (COLACE) 100 MG capsule Take 1 capsule (100 mg total) by mouth daily. 01/06/20   Ezzard Standing, PA-C  levothyroxine (SYNTHROID, LEVOTHROID) 112 MCG tablet Take 112 mcg by mouth daily.    [provider]  loratadine (CLARITIN) 10 MG tablet Take 10 mg by mouth daily as needed for allergies.  Patient not taking: Reported on 01/06/2020    [provider]  megestrol (MEGACE) 40 MG tablet Take 1 tablet (40 mg total) by mouth daily. 07/01/20   Idol, Almyra Free, PA-C  ondansetron (ZOFRAN ODT) 4 MG disintegrating tablet '4mg'$  ODT q4 hours prn nausea/vomit Patient taking differently: Take 4 mg by mouth every 4 (four) hours as needed for nausea or vomiting.  07/07/19   Jacqlyn Larsen, PA-C  Pediatric  Multivitamins-Iron Vicki Mallet W/IRON) 18 MG CHEW Chew 1 tablet by mouth daily. 01/12/20   Ezzard Standing, PA-C    Allergies    Sulfa antibiotics and Ferrous bisglycinate chelate [iron]  Review of Systems   Review of Systems  Constitutional:  Negative for fever.  Respiratory:  Negative for shortness of breath.   Cardiovascular:  Positive for palpitations. Negative for chest pain.  Gastrointestinal:  Negative for abdominal pain and vomiting.  Genitourinary:  Negative for dysuria.  All other systems reviewed and are negative.  Physical Exam Updated Vital Signs BP 97/63   Pulse 81   Temp 98.2 F (36.8 C) (Oral)   Resp 17   Ht 1.727 m ('5\' 8"'$ )   Wt 63 kg   SpO2 98%   BMI 21.13 kg/m   Physical Exam Vitals and nursing note reviewed.  Constitutional:      Appearance: She is well-developed. She is not ill-appearing.  HENT:     Head: Normocephalic and atraumatic.     Nose: Nose normal.     Mouth/Throat:     Mouth: Mucous membranes are moist.  Eyes:     Pupils: Pupils are equal, round, and reactive to light.  Cardiovascular:     Rate and Rhythm: Normal rate and regular rhythm.     Heart sounds: Normal heart sounds.  Pulmonary:     Effort: Pulmonary effort is normal. No respiratory distress.     Breath sounds: No wheezing.  Abdominal:     General: Bowel sounds are normal.     Palpations: Abdomen is soft.  Musculoskeletal:        General: No swelling.     Cervical back: Neck supple.     Right lower leg: No edema.     Left lower leg: No edema.     Comments: Bilateral feet well-perfused with good capillary refill, 2+ DP pulses bilaterally  Skin:    General: Skin is warm and dry.  Neurological:     Mental Status: She is alert and oriented to person, place, and time.  Psychiatric:        Mood and Affect: Mood normal.    ED Results / Procedures / Treatments   Labs (all labs ordered are listed, but only abnormal results are displayed) Labs Reviewed  CBC  WITH  DIFFERENTIAL/PLATELET - Abnormal; Notable for the following components:      Result Value   RBC 3.71 (*)    Hemoglobin 8.6 (*)    HCT 28.4 (*)    MCV 76.5 (*)    MCH 23.2 (*)    RDW 19.9 (*)    Platelets 484 (*)    All other components within normal limits  BASIC METABOLIC PANEL - Abnormal; Notable for the following components:   Sodium 134 (*)    Potassium 3.4 (*)    Glucose, Bld 100 (*)    Creatinine, Ser 1.14 (*)    Calcium 8.8 (*)    GFR, Estimated 58 (*)    All other components within normal limits  TSH - Abnormal; Notable for the following components:   TSH 56.902 (*)    All other components within normal limits  TROPONIN I (HIGH SENSITIVITY) - Abnormal; Notable for the following components:   Troponin I (High Sensitivity) 28 (*)    All other components within normal limits  TROPONIN I (HIGH SENSITIVITY) - Abnormal; Notable for the following components:   Troponin I (High Sensitivity) 22 (*)    All other components within normal limits    EKG EKG Interpretation  Date/Time:  Sunday December 11 2020 01:54:36 EDT Ventricular Rate:  85 PR Interval:  142 QRS Duration: 78 QT Interval:  424 QTC Calculation: 505 R Axis:   66 Text Interpretation: Sinus rhythm Probable left atrial enlargement Left ventricular hypertrophy Abnrm T, consider ischemia, anterolateral lds Confirmed by Thayer Jew [phone]) on 12/11/2020 2:08:39 AM  Radiology DG Chest Portable 1 View  Result Date: 12/11/2020 CLINICAL DATA:  Palpitation. EXAM: PORTABLE CHEST 1 VIEW COMPARISON:  None. FINDINGS: The heart size and mediastinal contours are within normal limits. Both lungs are clear. The visualized skeletal structures are unremarkable. IMPRESSION: No active disease. Electronically Signed   By: Anner Crete M.D.   On: 12/11/2020 02:27    Procedures Procedures   Medications Ordered in ED Medications  sodium chloride 0.9 % bolus 1,000 mL (0 mLs Intravenous Stopped 12/11/20 0526)    ED Course  I  have reviewed the triage vital signs and the nursing notes.  Pertinent labs & imaging results that were available during my care of the patient were reviewed by me and considered in my medical decision making (see chart for details).    MDM Rules/Calculators/A&P                           Patient presents with cocaine induced palpitations.  Reports binging over the last 24 to 48 hours.  She is nontoxic and vital signs are reassuring.  She is in no acute distress.  She is asymptomatic at this time.  EKG without acute ischemic or arrhythmic changes.  She reports bilateral foot numbness but this appears chronic and she is neurovascularly intact.  Labs obtained.  Notable for hemoglobin slightly lower than prior.  Patient denies any bloody stools.  She does have mild hypokalemia and hyponatremia as well as a slightly elevated creatinine at 1.14.  Likely reflective of dehydration.  She was given a normal saline bolus.  She has a history of thyroid disease and palpitations could be related to that.  TSH was obtained.  It is significantly elevated indicating likely underdosing of her thyroid medication.  However, do not feel this is likely contributing to palpitations.  Troponin initially 28 which is slightly elevated.  Repeat is 22.  She is not having any active chest pain.  In the setting of recent binge cocaine use, this is likely related.  Aside from age, no other risk factors for ACS.  EKG is nonischemic.  Patient was educated regarding drug abuse.  Recommend cessation.  Recommend she follow-up closely with her primary doctor for adjustment of her thyroid medication.  After history, exam, and medical workup I feel the patient has been appropriately medically screened and is safe for discharge home. Pertinent diagnoses were discussed with the patient. Patient was given return precautions.  Final Clinical Impression(s) / ED Diagnoses Final diagnoses:  Palpitations  Dehydration  Elevated TSH  Cocaine  abuse [facility])    Rx / DC Orders ED Discharge Orders     None        Ashtian Villacis, Barbette Hair, MD 12/11/20 [phone]

## 2020-12-11 NOTE — ED Triage Notes (Signed)
Pt states she has been smoking crack cocaine for the last 4 days (first time user) and now presents with c/o "heart racing and numbness to feet" bilaterally.

## 2021-12-04 IMAGING — CT CT ABD-PELV W/ CM
3 of 5 series · 16 of 46 positions shown, 18 images · IV contrast (Omnipaque or Isovue)
Comparison: May 03, 2019

CLINICAL DATA: Left lower quadrant abdominal pain. History of
cervical cancer.

EXAM:
CT ABDOMEN AND PELVIS WITH CONTRAST
TECHNIQUE: Multidetector CT imaging of the abdomen and pelvis was performed
using the standard protocol following bolus administration of
intravenous contrast.
CONTRAST:  100mL OMNIPAQUE IOHEXOL 300 MG/ML  SOLN

[Series 2: axial st · axial · 0.88mm/px · z∈[+965,+1390]mm · 11 of 103 slices shown, 13 images]
[im 9/103  soft-tissue]
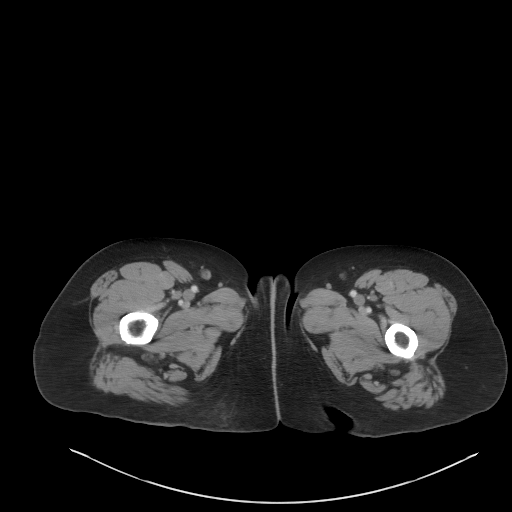
[im 9/103  bone]
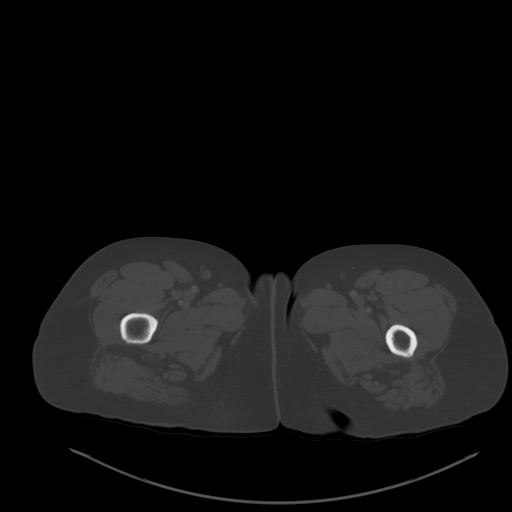
[im 18/103  soft-tissue]
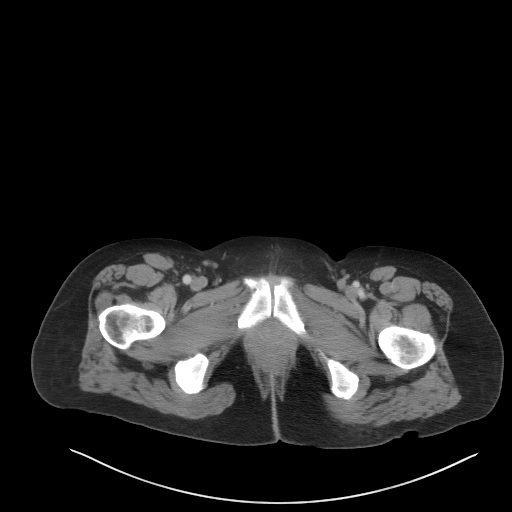
[im 26/103  soft-tissue]
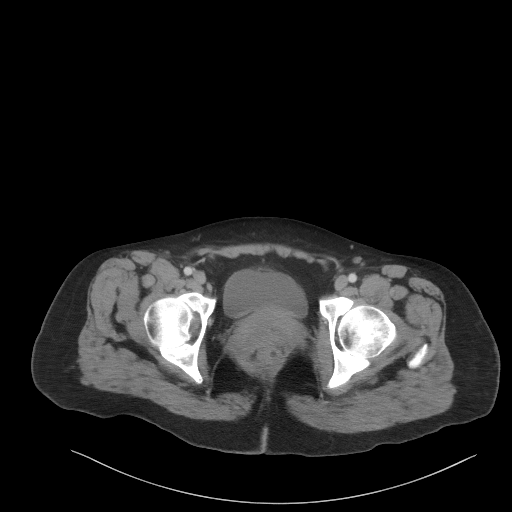
[im 35/103  soft-tissue]
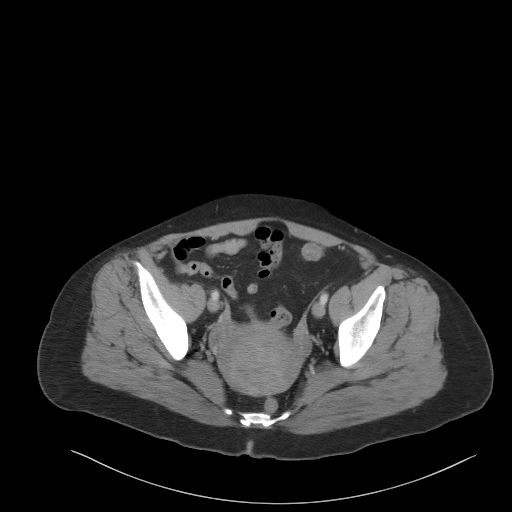
[im 43/103  soft-tissue]
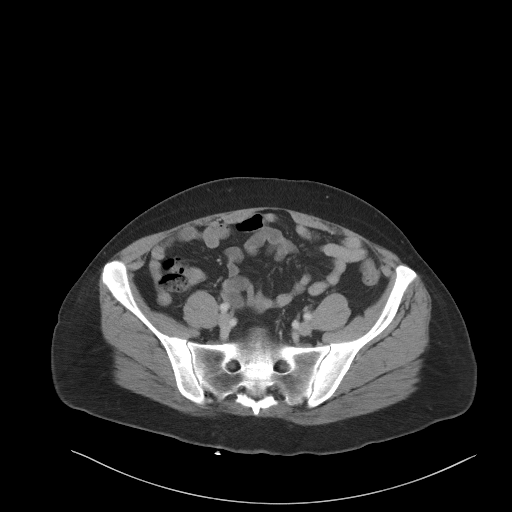
[im 52/103  soft-tissue]
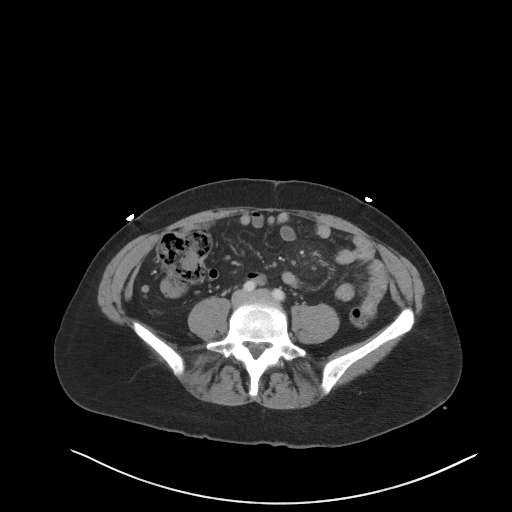
[im 60/103  soft-tissue]
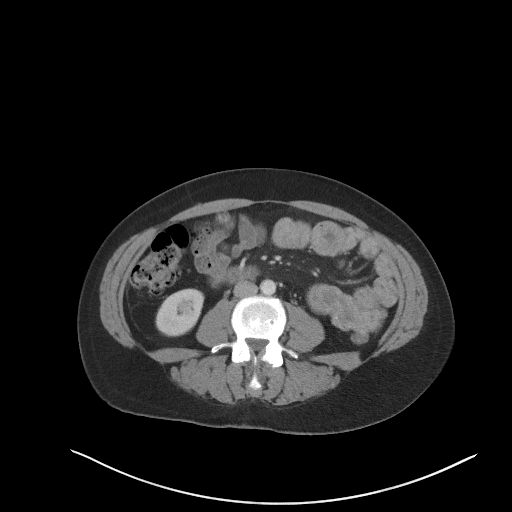
[im 69/103  soft-tissue]
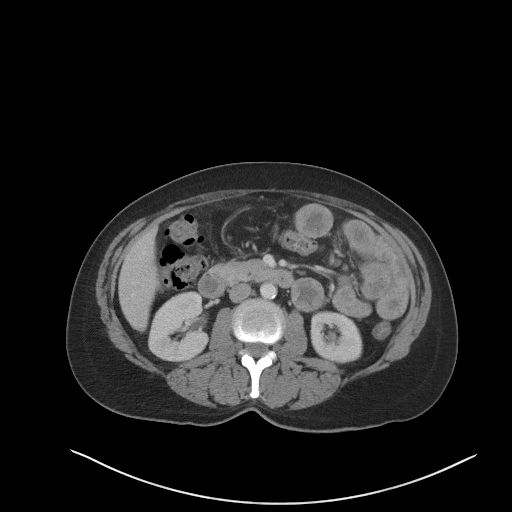
[im 77/103  soft-tissue]
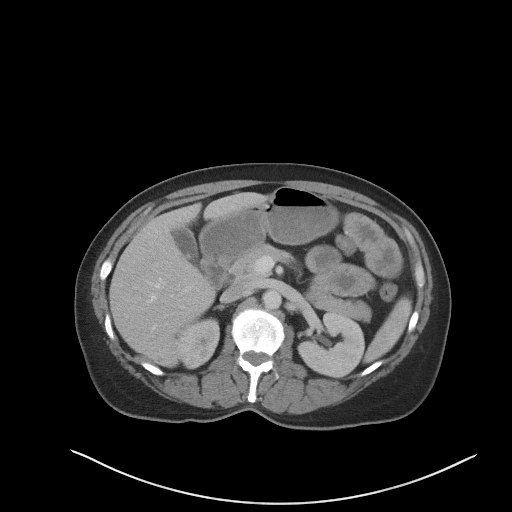
[im 77/103  bone]
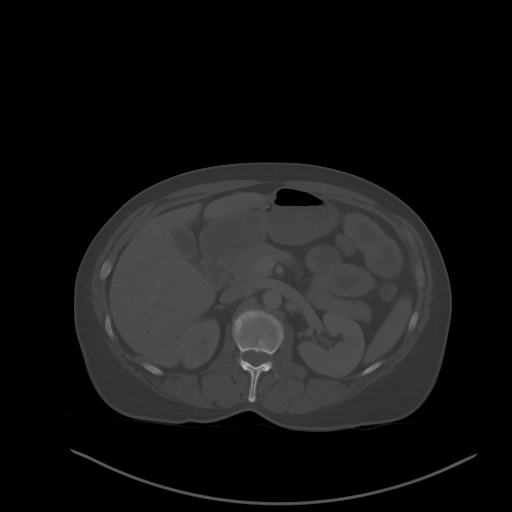
[im 86/103  soft-tissue]
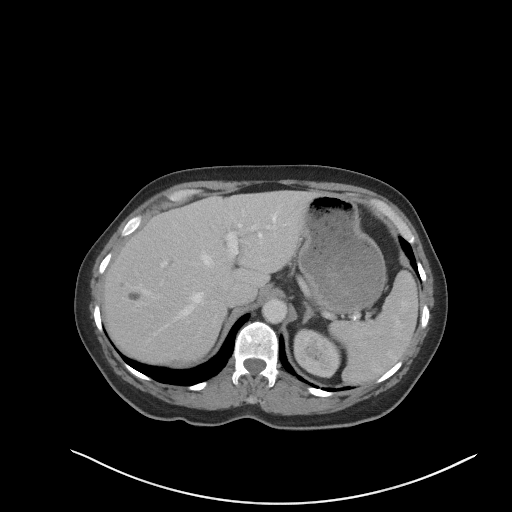
[im 94/103  soft-tissue]
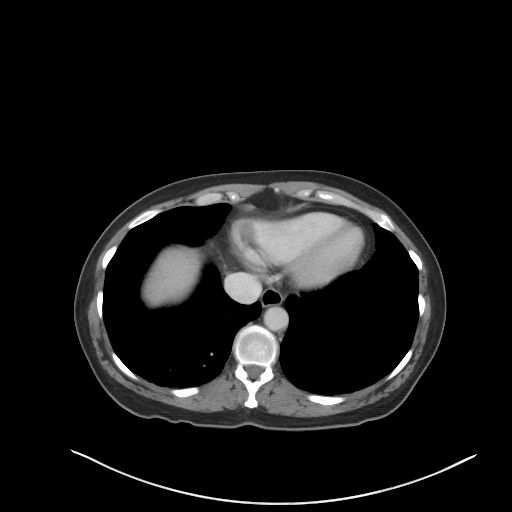

[Series 5: lung bases · axial · 0.88mm/px · z∈[+1259,+1291]mm · 2 of 96 slices shown]
[im 8/96  bone]
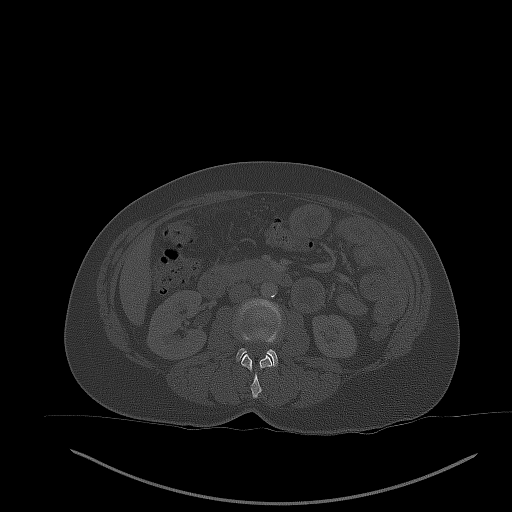
[im 24/96  bone]
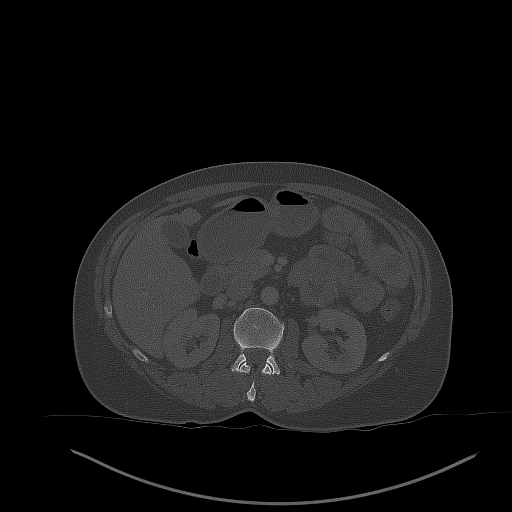

[Series 6: coronal st · coronal · 0.82mm/px · 3 of 93 slices shown]
[im 31/93  soft-tissue]
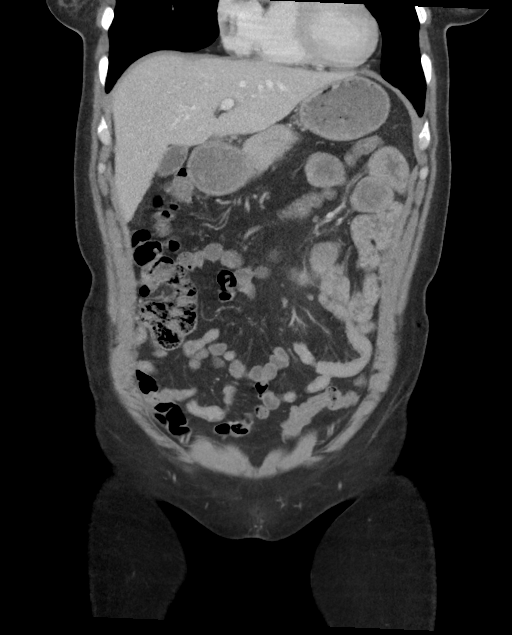
[im 41/93  soft-tissue]
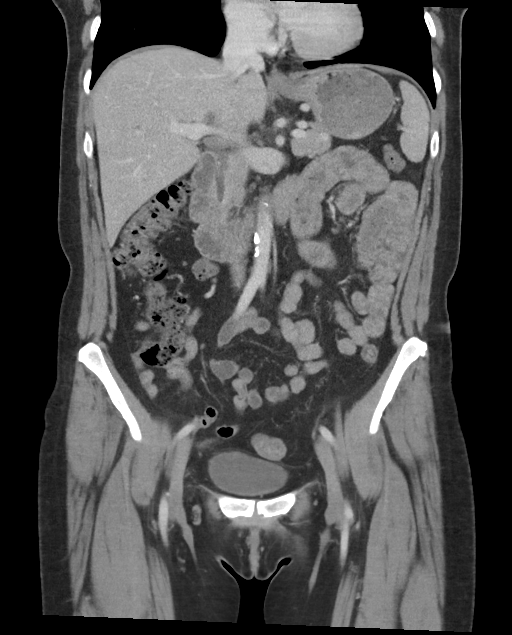
[im 52/93  soft-tissue]
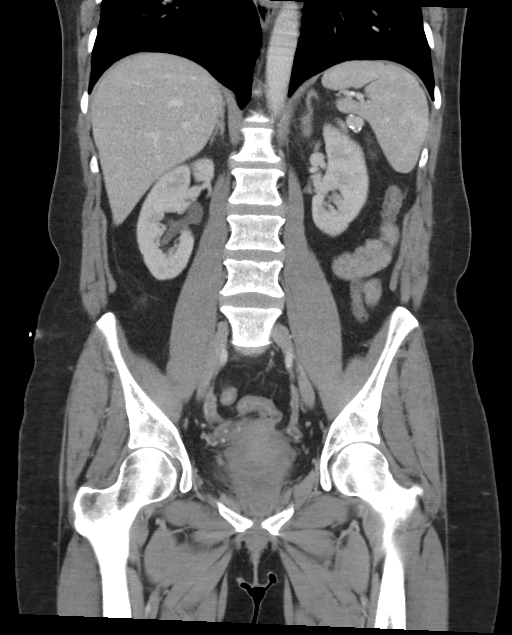

[16 of 46 positions shown; findings below may reference images not displayed]

FINDINGS: Lower chest: The lung bases are clear. The heart size is normal.

Hepatobiliary: Multiple hepatic cysts are noted. Normal
gallbladder.There is no biliary ductal dilation.

Pancreas: Normal contours without ductal dilatation. No
peripancreatic fluid collection.

Spleen: No splenic laceration or hematoma.

Adrenals/Urinary Tract:

--Adrenal glands: There is a stable left.

--Right kidney/ureter: No hydronephrosis or perinephric hematoma.

--Left kidney/ureter: No hydronephrosis or perinephric hematoma.

--Urinary bladder: Unremarkable.

Stomach/Bowel:

--Stomach/Duodenum: No hiatal hernia or other gastric abnormality.
Normal duodenal course and caliber.

--Small bowel: There is some mild enhancement of the small bowel in
the patient's left mid abdomen and left lower quadrant. No evidence
for small bowel obstruction.

--Colon: No focal abnormality.

--Appendix: Normal.

Vascular/Lymphatic: Atherosclerotic calcification is present within
the non-aneurysmal abdominal aorta, without hemodynamically
significant stenosis. There is a 1.3 cm splenic artery aneurysm.
This is essentially stable since 4429 and requires no further
follow-up.

--No retroperitoneal lymphadenopathy.

--No mesenteric lymphadenopathy.

--No pelvic or inguinal lymphadenopathy.

Reproductive: There is a probable fibroid involving the uterine
fundus as before.

Other: No ascites or free air. The abdominal wall is normal.

Musculoskeletal. No acute displaced fractures.
IMPRESSION: 1. There is some mild enhancement of the small bowel in the left mid
abdomen and left lower quadrant. Findings are nonspecific but may
represent enteritis. No bowel obstruction.
2. Aortic Atherosclerosis ([id number]).

## 2021-12-25 ENCOUNTER — Other Ambulatory Visit: Payer: Self-pay

## 2021-12-25 ENCOUNTER — Emergency Department (HOSPITAL_COMMUNITY)
Admission: EM | Admit: 2021-12-25 | Discharge: 2021-12-25 | Disposition: A | Discharge status: Home / Self Care | Payer: Medicaid Other | Attending: Emergency Medicine | Admitting: Emergency Medicine

## 2021-12-25 ENCOUNTER — Emergency Department (HOSPITAL_COMMUNITY): Payer: Medicaid Other

## 2021-12-25 ENCOUNTER — Encounter (HOSPITAL_COMMUNITY): Payer: Self-pay | Admitting: *Deleted

## 2021-12-25 DIAGNOSIS — R531 Weakness: Secondary | ICD-10-CM

## 2021-12-25 DIAGNOSIS — R0602 Shortness of breath: Secondary | ICD-10-CM | POA: Insufficient documentation

## 2021-12-25 DIAGNOSIS — Z8541 Personal history of malignant neoplasm of cervix uteri: Secondary | ICD-10-CM | POA: Insufficient documentation

## 2021-12-25 DIAGNOSIS — E039 Hypothyroidism, unspecified: Secondary | ICD-10-CM | POA: Insufficient documentation

## 2021-12-25 DIAGNOSIS — M6281 Muscle weakness (generalized): Secondary | ICD-10-CM | POA: Insufficient documentation

## 2021-12-25 DIAGNOSIS — F17210 Nicotine dependence, cigarettes, uncomplicated: Secondary | ICD-10-CM | POA: Insufficient documentation

## 2021-12-25 DIAGNOSIS — Z20822 Contact with and (suspected) exposure to covid-19: Secondary | ICD-10-CM | POA: Insufficient documentation

## 2021-12-25 LAB — BASIC METABOLIC PANEL
Anion gap: 6 (ref 5–15)
BUN: 19 mg/dL (ref 6–20)
CO2: 25 mmol/L (ref 22–32)
Calcium: 9.1 mg/dL (ref 8.9–10.3)
Chloride: 108 mmol/L (ref 98–111)
Creatinine, Ser: 0.7 mg/dL (ref 0.44–1.00)
GFR, Estimated: >60 mL/min (ref >60)
Glucose, Bld: 95 mg/dL (ref 70–99)
Potassium: 3.7 mmol/L (ref 3.5–5.1)
Sodium: 139 mmol/L (ref 135–145)

## 2021-12-25 LAB — URINALYSIS, ROUTINE W REFLEX MICROSCOPIC
Bilirubin Urine: NEGATIVE
Glucose, UA: 50 mg/dL — AB
Ketones, ur: NEGATIVE mg/dL
Nitrite: NEGATIVE
Protein, ur: NEGATIVE mg/dL
Specific Gravity, Urine: 1.012 (ref 1.005–1.030)
pH: 7 (ref 5.0–8.0)

## 2021-12-25 LAB — CBC WITH DIFFERENTIAL/PLATELET
Abs Immature Granulocytes: 0.05 10*3/uL (ref 0.00–0.07)
Basophils Absolute: 0 10*3/uL (ref 0.0–0.1)
Basophils Relative: 0 %
Eosinophils Absolute: 0.2 10*3/uL (ref 0.0–0.5)
Eosinophils Relative: 2 %
HCT: 34.9 % — ABNORMAL LOW (ref 36.0–46.0)
Hemoglobin: 11 g/dL — ABNORMAL LOW (ref 12.0–15.0)
Immature Granulocytes: 1 %
Lymphocytes Relative: 23 %
Lymphs Abs: 1.6 10*3/uL (ref 0.7–4.0)
MCH: 27.8 pg (ref 26.0–34.0)
MCHC: 31.5 g/dL (ref 30.0–36.0)
MCV: 88.1 fL (ref 80.0–100.0)
Monocytes Absolute: 0.3 10*3/uL (ref 0.1–1.0)
Monocytes Relative: 5 %
Neutro Abs: 4.8 10*3/uL (ref 1.7–7.7)
Neutrophils Relative %: 69 %
Platelets: 289 10*3/uL (ref 150–400)
RBC: 3.96 MIL/uL (ref 3.87–5.11)
RDW: 15.7 % — ABNORMAL HIGH (ref 11.5–15.5)
WBC: 6.9 10*3/uL (ref 4.0–10.5)
nRBC: 0 % (ref 0.0–0.2)

## 2021-12-25 LAB — SARS CORONAVIRUS 2 BY RT PCR: SARS Coronavirus 2 by RT PCR: NEGATIVE

## 2021-12-25 MED ORDER — ALBUTEROL SULFATE HFA 108 (90 BASE) MCG/ACT IN AERS: 2.0000 | INHALATION_SPRAY | RESPIRATORY_TRACT | Status: DC | PRN

## 2021-12-25 NOTE — ED Triage Notes (Signed)
Pt c/o feeling weak x 6 days

## 2021-12-25 NOTE — Discharge Instructions (Signed)
Work-up here today with some mild anemia.  Otherwise negative COVID-negative chest x-ray negative electrolytes normal.  No elevated white blood cell count.  Recommend following back up with your primary care doctor.  Also you have a history of a thyroid problem.  Your primary care doctor could make sure your thyroid function test are doing well or normal.

## 2021-12-25 NOTE — ED Provider Notes (Signed)
[facility] EMERGENCY DEPARTMENT Provider Note   CSN: [id number] Arrival date & time: 12/25/21  1002     History  Chief Complaint  Patient presents with   Weakness    Cathy Munoz is a 51 y.o. female.  Patient with a complaint of sort of feeling weak for about 6 days.  Some mild shortness of breath associated with it.  Patient's been having heavier vaginal bleeding lately but no vaginal bleeding now.  Patient's past medical history is significant for cervical cancer and ovarian cyst hypothyroidism history of kidney stones patient is an everyday smoker half pack a day.  Vital signs here are reassuring no fever oxygen saturations 98% on room air.       Home Medications Prior to Admission medications   Medication Sig Start Date End Date Taking? Authorizing Provider  acetaminophen (TYLENOL) 500 MG tablet Take 500 mg by mouth every 6 (six) hours as needed for moderate pain or headache.   Yes [provider]  levothyroxine (SYNTHROID) 25 MCG tablet Take 25 mcg by mouth daily. 11/01/21  Yes [provider]  omeprazole (PRILOSEC) 20 MG capsule Take 20 mg by mouth daily as needed. 11/01/21  Yes [provider]  dicyclomine (BENTYL) 10 MG capsule Take 1 capsule (10 mg total) by mouth 3 (three) times daily as needed for spasms (diarrhea, abd pain). Patient not taking: Reported on 12/25/2021 09/22/19   Ezzard Standing, PA-C  diphenhydrAMINE (BENADRYL) 50 MG capsule Take 50 mg by mouth every 6 (six) hours as needed. Patient not taking: Reported on 12/25/2021    [provider]  docusate sodium (COLACE) 100 MG capsule Take 1 capsule (100 mg total) by mouth daily. Patient not taking: Reported on 12/25/2021 01/06/20   Laurine Blazer B, PA-C  loratadine (CLARITIN) 10 MG tablet Take 10 mg by mouth daily as needed for allergies.  Patient not taking: Reported on 01/06/2020    [provider]  megestrol (MEGACE) 40 MG tablet Take 1 tablet (40 mg total) by mouth  daily. Patient not taking: Reported on 12/25/2021 07/01/20   Evalee Jefferson, PA-C  ondansetron (ZOFRAN ODT) 4 MG disintegrating tablet '4mg'$  ODT q4 hours prn nausea/vomit Patient not taking: Reported on 12/25/2021 07/07/19   Jacqlyn Larsen, PA-C  Pediatric Multivitamins-Iron Vicki Mallet W/IRON) 18 MG CHEW Chew 1 tablet by mouth daily. Patient not taking: Reported on 12/25/2021 01/12/20   Laurine Blazer B, PA-C      Allergies    Sulfa antibiotics and Ferrous bisglycinate chelate [iron]    Review of Systems   Review of Systems  Constitutional:  Positive for fatigue. Negative for chills and fever.  HENT:  Negative for rhinorrhea and sore throat.   Eyes:  Negative for visual disturbance.  Respiratory:  Positive for shortness of breath. Negative for cough.   Cardiovascular:  Negative for chest pain and leg swelling.  Gastrointestinal:  Negative for abdominal pain, diarrhea, nausea and vomiting.  Genitourinary:  Negative for dysuria.  Musculoskeletal:  Negative for back pain and neck pain.  Skin:  Negative for rash.  Neurological:  Positive for weakness. Negative for dizziness, light-headedness and headaches.  Hematological:  Does not bruise/bleed easily.  Psychiatric/Behavioral:  Negative for confusion.     Physical Exam Updated Vital Signs BP (!) 165/100 (BP Location: Left Arm)   Pulse 64   Temp 99 F (37.2 C) (Oral)   Resp 18   Ht 1.727 m ('5\' 8"'$ )   Wt 63.5 kg   LMP 12/11/2021  SpO2 100%   BMI 21.29 kg/m  Physical Exam Vitals and nursing note reviewed.  Constitutional:      General: She is not in acute distress.    Appearance: Normal appearance. She is well-developed.  HENT:     Head: Normocephalic and atraumatic.  Eyes:     Extraocular Movements: Extraocular movements intact.     Conjunctiva/sclera: Conjunctivae normal.     Pupils: Pupils are equal, round, and reactive to light.  Cardiovascular:     Rate and Rhythm: Normal rate and regular rhythm.     Heart sounds: No murmur  heard. Pulmonary:     Effort: Pulmonary effort is normal. No respiratory distress.     Breath sounds: Normal breath sounds. No wheezing, rhonchi or rales.  Chest:     Chest wall: No tenderness.  Abdominal:     Palpations: Abdomen is soft.     Tenderness: There is no abdominal tenderness.  Musculoskeletal:        General: No swelling.     Cervical back: Normal range of motion and neck supple.     Right lower leg: No edema.     Left lower leg: No edema.  Skin:    General: Skin is warm and dry.     Capillary Refill: Capillary refill takes less than 2 seconds.  Neurological:     General: No focal deficit present.     Mental Status: She is alert and oriented to person, place, and time.     Cranial Nerves: No cranial nerve deficit.     Sensory: No sensory deficit.     Motor: No weakness.     Coordination: Coordination normal.  Psychiatric:        Mood and Affect: Mood normal.     ED Results / Procedures / Treatments   Labs (all labs ordered are listed, but only abnormal results are displayed) Labs Reviewed  URINALYSIS, ROUTINE W REFLEX MICROSCOPIC - Abnormal; Notable for the following components:      Result Value   Color, Urine STRAW (*)    Glucose, UA 50 (*)    Hgb urine dipstick MODERATE (*)    Leukocytes,Ua TRACE (*)    Bacteria, UA RARE (*)    All other components within normal limits  CBC WITH DIFFERENTIAL/PLATELET - Abnormal; Notable for the following components:   Hemoglobin 11.0 (*)    HCT 34.9 (*)    RDW 15.7 (*)    All other components within normal limits  SARS CORONAVIRUS 2 BY RT PCR  BASIC METABOLIC PANEL    EKG EKG Interpretation  Date/Time:  Monday December 25 2021 10:14:02 EDT Ventricular Rate:  56 PR Interval:  150 QRS Duration: 87 QT Interval:  402 QTC Calculation: 388 R Axis:   78 Text Interpretation: Sinus rhythm LVH with secondary repolarization abnormality Confirmed by Fredia Sorrow [phone]) on 12/25/2021 11:12:56 AM  Radiology DG Chest  2 View  Result Date: 12/25/2021 CLINICAL DATA:  Shortness of breath. EXAM: CHEST - 2 VIEW COMPARISON:  December 11, 2020. FINDINGS: The heart size and mediastinal contours are within normal limits. Both lungs are clear. The visualized skeletal structures are unremarkable. IMPRESSION: No active cardiopulmonary disease. Electronically Signed   By: Marijo Conception M.D.   On: 12/25/2021 10:49    Procedures Procedures    Medications Ordered in ED Medications  albuterol (VENTOLIN HFA) 108 (90 Base) MCG/ACT inhaler 2 puff (has no administration in time range)    ED Course/ Medical Decision Making/ A&P  Medical Decision Making Amount and/or Complexity of Data Reviewed Labs: ordered. Radiology: ordered.  Risk Prescription drug management.   Patient's work-up here oxygen saturations very good lungs are clear.  No leg swelling.  Urinalysis negative COVID-negative patient's hemoglobin down a little bit 11.0.  White blood cell count 6.9.  Basic metabolic panel normal.  Chest x-ray negative.  Recommend follow-up with primary care provider for the symptoms of the fatigue and a little bit of shortness of breath.  Clinically not concerned about PE not tachycardic oxygen sats are excellent.  Patient has a history of hypothyroidism.  Having her thyroid function test by her primary care doctor would be appropriate as well.  Anemia could be playing a role.  Patient can follow-up with her primary care doctor Dr. Doren Custard. Final Clinical Impression(s) / ED Diagnoses Final diagnoses:  Generalized weakness  SOB (shortness of breath)    Rx / DC Orders ED Discharge Orders     None         Fredia Sorrow, MD 12/25/21 1241

## 2023-09-18 ENCOUNTER — Encounter: Payer: Self-pay | Admitting: Urology

## 2023-09-18 ENCOUNTER — Ambulatory Visit (INDEPENDENT_AMBULATORY_CARE_PROVIDER_SITE_OTHER): Admitting: Urology

## 2023-09-18 VITALS — BP 146/86 | HR 74

## 2023-09-18 DIAGNOSIS — R319 Hematuria, unspecified: Secondary | ICD-10-CM | POA: Insufficient documentation

## 2023-09-18 DIAGNOSIS — Z87442 Personal history of urinary calculi: Secondary | ICD-10-CM | POA: Diagnosis not present

## 2023-09-18 DIAGNOSIS — F17200 Nicotine dependence, unspecified, uncomplicated: Secondary | ICD-10-CM | POA: Insufficient documentation

## 2023-09-18 DIAGNOSIS — F1410 Cocaine abuse, uncomplicated: Secondary | ICD-10-CM | POA: Insufficient documentation

## 2023-09-18 LAB — URINALYSIS, ROUTINE W REFLEX MICROSCOPIC
Bilirubin, UA: NEGATIVE
Ketones, UA: NEGATIVE
Leukocytes,UA: NEGATIVE
Nitrite, UA: NEGATIVE
Protein,UA: NEGATIVE
Specific Gravity, UA: 1.025 (ref 1.005–1.030)
Urobilinogen, Ur: 0.2 mg/dL (ref 0.2–1.0)
pH, UA: 6 (ref 5.0–7.5)

## 2023-09-18 LAB — MICROSCOPIC EXAMINATION: Bacteria, UA: NONE SEEN

## 2023-09-18 LAB — BLADDER SCAN AMB NON-IMAGING: Scan Result: 57

## 2023-09-18 NOTE — Progress Notes (Signed)
 Name: Cathy Munoz DOB: 01-21-1971 MRN: [id number]  History of Present Illness: Cathy Munoz is a 53 y.o. female who presents today as a new patient at [facility] [place].  GU History includes: 1. Kidney stones.  She reports chief complaint of persistent microscopic hematuria and occasional gross hematuria. > 07/20/2022: - UA: small blood, otherwise unremarkable - Urine microscopy: 2 WBC/hpf, 2 RBC/hpf, few bacteria  > 11/09/2022: - UA: moderate blood, otherwise unremarkable - Urine microscopy: 1 WBC/hpf, 3 RBC/hpf, few bacteria  > 08/11/2023: - UA: trace blood, otherwise unremarkable - Urine microscopy: 1 WBC/hpf, 1 RBC/hpf, no bacteria  > 08/19/2023: - Normal renal function (creatinine 0.65, GFR >60).  > 08/27/2023:  - Seen by PCP.  - Reported intermittent right flank pain and urinary frequency which "she thinks this may be due to anxiety and aging". - Per note: "Longstanding history of hematuria" (unspecified if microscopic or gross hematuria).  - UA: 2+ blood; otherwise unremarkable. - Urine microscopy: not done.  > 09/04/2023: - Outside renal/bladder ultrasound (RUS) showed a simple 13 mm left renal cyst with no GU stones, masses, or hydronephrosis; bladder unremarkable.  > 09/05/2023: - UA: 2+ blood; otherwise unremarkable. - Urine microscopy: not done. - Urine culture negative.  Today She reports nocturia (>5x/night) and urinary urgency. Denies dysuria, gross hematuria, straining to void, sensations of incomplete emptying, or abdominal pain. Reports occasional right flank / low back pain; not present at this time.  She denies history of GU malignancy or pelvic radiation; reports that her sister had kidney cancer.  She denies history of autoimmune disease. She reports history of smoking (quit last year; smoked approximately 0.5 ppd x5 years). She denies taking anticoagulants.  Medications: Current Outpatient Medications  Medication Sig Dispense Refill    acetaminophen  (TYLENOL ) 500 MG tablet Take 500 mg by mouth every 6 (six) hours as needed for moderate pain or headache.     dicyclomine  (BENTYL ) 10 MG capsule Take 1 capsule (10 mg total) by mouth 3 (three) times daily as needed for spasms (diarrhea, abd pain). 90 capsule 1   diphenhydrAMINE (BENADRYL) 50 MG capsule Take 50 mg by mouth every 6 (six) hours as needed.     docusate sodium  (COLACE) 100 MG capsule Take 1 capsule (100 mg total) by mouth daily. 30 capsule 3   levothyroxine (SYNTHROID) 25 MCG tablet Take 25 mcg by mouth daily.     loratadine (CLARITIN) 10 MG tablet Take 10 mg by mouth daily as needed for allergies.     megestrol  (MEGACE ) 40 MG tablet Take 1 tablet (40 mg total) by mouth daily. 10 tablet 0   omeprazole  (PRILOSEC) 20 MG capsule Take 20 mg by mouth daily as needed.     ondansetron  (ZOFRAN  ODT) 4 MG disintegrating tablet 4mg  ODT q4 hours prn nausea/vomit 10 tablet 0   Pediatric Multivitamins-Iron  (FLINTSTONES W/IRON ) 18 MG CHEW Chew 1 tablet by mouth daily. 30 tablet 0   No current facility-administered medications for this visit.    Allergies: Allergies  Allergen Reactions   Sulfa Antibiotics Anaphylaxis   Ferrous Bisglycinate Chelate [Iron ] Rash    Top and bottom lip swelling    Past Medical History:  Diagnosis Date   Anemia    Cancer (HCC)    cervical   History of kidney stones    Hypothyroidism    Ovarian cyst    left   Shingles 2004   Thyroid  disease    hypothyroidism   Vaginal Pap smear, abnormal  conization 1991   Past Surgical History:  Procedure Laterality Date   BIOPSY  10/07/2019   Procedure: BIOPSY;  Surgeon: Ruby Corporal, MD;  [facility];  Service: Endoscopy;;  duodenal/random colon   CERVICAL CONE BIOPSY     COLONOSCOPY N/A 10/07/2019   Procedure: COLONOSCOPY;  Surgeon: Ruby Corporal, MD;  [facility];  Service: Endoscopy;  Laterality: N/A;  1:10   DILATION AND CURETTAGE OF UTERUS      ESOPHAGOGASTRODUODENOSCOPY N/A 10/07/2019   Procedure: ESOPHAGOGASTRODUODENOSCOPY (EGD);  Surgeon: Ruby Corporal, MD;  [facility];  Service: Endoscopy;  Laterality: N/A;   Family History  Problem Relation Age of Onset   Heart failure Father    Anuerysm Father    Asthma Father    Diabetes Father    Abdominal Wall Hernia Father    Hyperlipidemia Father    Cancer Sister        kidney cancer   Cancer Maternal Uncle        pancreatic   Hypertension Maternal Aunt    Cancer Paternal Aunt        breast   Stroke Mother    Social History   Socioeconomic History   Marital status: Single    Spouse name: Not on file   Number of children: Not on file   Years of education: Not on file   Highest education level: Not on file  Occupational History   Not on file  Tobacco Use   Smoking status: Former    Average packs/day: 0.5 packs/day for 5.0 years (2.5 ttl pk-yrs)    Types: Cigarettes    Start date: 2019   Smokeless tobacco: Never  Vaping Use   Vaping status: Some Days   Substances: Flavoring  Substance and Sexual Activity   Alcohol use: No   Drug use: No   Sexual activity: Yes    Birth control/protection: None  Other Topics Concern   Not on file  Social History Narrative   Not on file   Social Drivers of Health   Financial Resource Strain: Not on file  Food Insecurity: Not on file  Transportation Needs: Not on file  Physical Activity: Not on file  Stress: Not on file  Social Connections: Not on file  Intimate Partner Violence: Not on file    SUBJECTIVE  Review of Systems Constitutional: Patient denies any unintentional weight loss or change in strength lntegumentary: Patient denies any rashes or pruritus Cardiovascular: Patient denies chest pain or syncope Respiratory: Patient denies shortness of breath Gastrointestinal: Patient denies nausea or vomiting Musculoskeletal: Patient denies muscle cramps or weakness Neurologic: Patient denies convulsions  or seizures Allergic/Immunologic: Patient denies recent allergic reaction(s) Hematologic/Lymphatic: Patient denies bleeding tendencies Endocrine: Patient denies heat/cold intolerance  GU: As per HPI.  OBJECTIVE Vitals:   09/18/23 1140  BP: (!) 146/86  Pulse: 74   There is no height or weight on file to calculate BMI.  Physical Examination Constitutional: No obvious distress; patient is non-toxic appearing  Cardiovascular: No visible lower extremity edema.  Respiratory: The patient does not have audible wheezing/stridor; respirations do not appear labored  Gastrointestinal: Abdomen non-distended Musculoskeletal: Normal ROM of UEs  Skin: No obvious rashes/open sores  Neurologic: CN 2-12 grossly intact Psychiatric: Answered questions appropriately with mildly tangential affect  Hematologic/Lymphatic/Immunologic: No obvious bruises or sites of spontaneous bleeding  Urine microscopy: 11-30 RBC/hpf, otherwise unremarkable PVR: 57 ml  ASSESSMENT Hematuria, unspecified type - Plan: BLADDER SCAN AMB NON-IMAGING, Cytology, urine,  CT HEMATURIA WORKUP, Urinalysis, Routine w reflex microscopic, CANCELED: Urinalysis, Routine w reflex microscopic, CANCELED: Cytology, urine, CANCELED: BLADDER SCAN AMB NON-IMAGING  Smoker - Plan: CT HEMATURIA WORKUP, Urinalysis, Routine w reflex microscopic, CANCELED: Cytology, urine, CANCELED: BLADDER SCAN AMB NON-IMAGING  History of kidney stones - Plan: CT HEMATURIA WORKUP, Urinalysis, Routine w reflex microscopic, CANCELED: Cytology, urine, CANCELED: BLADDER SCAN AMB NON-IMAGING  For microscopic hematuria we discussed possible etiologies including but not limited to: vigorous exercise, sexual activity, stone, trauma, blood thinner use, urinary tract infection, urethral irritation secondary to vaginal atrophy, chronic kidney disease, glomerulonephropathy, malignancy. We discussed pt's nicotine use as a risk factor for GU cancer and encouraged continued  cessation.  We reviewed the AUA 2020 [facility] guideline and risk stratification for this patient. Based on individual risk factors, pt was advised that the recommended workup includes CT hematuria protocol and cystoscopy. Pt decided to pursue this work-up and follow-up afterward to discuss the results and formulate a treatment plan based on the findings. All questions were answered.   PLAN Advised the following: CT ordered. Voided cytology ordered. Return for 1st available cystoscopy with any urology MD.  Orders Placed This Encounter  Procedures   CT HEMATURIA WORKUP    Standing Status:   Future    Expiration Date:   09/17/2024    Reason for Exam (SYMPTOM  OR DIAGNOSIS REQUIRED):   Gross hematuria    Is patient pregnant?:   No    Preferred imaging location?:   [facility]   Urinalysis, Routine w reflex microscopic   BLADDER SCAN AMB NON-IMAGING    It has been explained that the patient is to follow regularly with their PCP in addition to all other providers involved in their care and to follow instructions provided by these respective offices. Patient advised to contact urology clinic if any urologic-pertaining questions, concerns, new symptoms or problems arise in the interim period.  There are no Patient Instructions on file for this visit.  Electronically signed by:  Lauretta Ponto, MSN, FNP-C, CUNP 09/18/2023 12:04 PM

## 2023-09-20 LAB — CYTOLOGY, URINE

## 2023-09-23 ENCOUNTER — Ambulatory Visit: Payer: Self-pay | Admitting: Urology

## 2023-09-23 NOTE — Telephone Encounter (Signed)
 Tried calling patient with no answer and unable to leave voiced message due to mailbox not set up.

## 2023-09-23 NOTE — Telephone Encounter (Signed)
-----   Message from Lauretta Ponto sent at 09/23/2023  8:50 AM EDT ----- Please notify patient: Negative voided cytology - no malignant findings. Follow up for cystoscopy and CT urogram as planned for further evaluation. Thanks.

## 2023-10-24 ENCOUNTER — Encounter: Payer: Self-pay | Admitting: Internal Medicine

## 2023-10-24 ENCOUNTER — Ambulatory Visit: Attending: Internal Medicine | Admitting: Internal Medicine

## 2023-10-24 VITALS — BP 130/80 | HR 67 | Ht 68.5 in | Wt 168.8 lb

## 2023-10-24 DIAGNOSIS — Z136 Encounter for screening for cardiovascular disorders: Secondary | ICD-10-CM | POA: Insufficient documentation

## 2023-10-24 DIAGNOSIS — R9431 Abnormal electrocardiogram [ECG] [EKG]: Secondary | ICD-10-CM | POA: Insufficient documentation

## 2023-10-24 NOTE — Patient Instructions (Addendum)
Medication Instructions:  Continue all current medications.  Labwork: none  Testing/Procedures: none  Follow-Up: As needed.    Any Other Special Instructions Will Be Listed Below (If Applicable).  If you need a refill on your cardiac medications before your next appointment, please call your pharmacy.  

## 2023-10-24 NOTE — Progress Notes (Signed)
 Cardiology Office Note  Date: 10/24/2023   ID: Cathy Munoz, DOB Oct 28, 1970, MRN [id number]  PCP:  Melvenia Sor, NP  Cardiologist:  None Electrophysiologist:  None   History of Present Illness: Cathy Munoz is a 53 y.o. female  Referred to cardiology clinic for self reported history of RBBB. EKG performed at PCP's office showed NSR, non-specific TWI in V2 and V3. EKG today in the clinic showed NSR, and LVH with repolarization abnormality.  Denied angina, DOE, palpitations, light-headedness, syncope and LE swelling. No prior ischemia evaluation. No prior MI/PCI/CABG.  Patient was homeless for one year and now she is back on her feet is what she said.   Past Medical History:  Diagnosis Date   Anemia    Cancer (HCC)    cervical   History of kidney stones    Hypothyroidism    Ovarian cyst    left   Shingles 2004   Thyroid  disease    hypothyroidism   Vaginal Pap smear, abnormal    conization 1991    Past Surgical History:  Procedure Laterality Date   BIOPSY  10/07/2019   Procedure: BIOPSY;  Surgeon: Golda Claudis PENNER, MD;  [facility];  Service: Endoscopy;;  duodenal/random colon   CERVICAL CONE BIOPSY     COLONOSCOPY N/A 10/07/2019   Procedure: COLONOSCOPY;  Surgeon: Golda Claudis PENNER, MD;  [facility];  Service: Endoscopy;  Laterality: N/A;  1:10   DILATION AND CURETTAGE OF UTERUS     ESOPHAGOGASTRODUODENOSCOPY N/A 10/07/2019   Procedure: ESOPHAGOGASTRODUODENOSCOPY (EGD);  Surgeon: Golda Claudis PENNER, MD;  [facility];  Service: Endoscopy;  Laterality: N/A;    Current Outpatient Medications  Medication Sig Dispense Refill   levothyroxine (SYNTHROID) 25 MCG tablet Take 25 mcg by mouth daily.     omeprazole  (PRILOSEC) 20 MG capsule Take 20 mg by mouth daily as needed.     No current facility-administered medications for this visit.   Allergies:  Sulfa antibiotics and Ferrous bisglycinate chelate Carrol.[email or web address] ]   Social History: The patient   reports that she has quit smoking. Her smoking use included cigarettes. She started smoking about 6 years ago. She has a 2.5 pack-year smoking history. She has never used smokeless tobacco. She reports that she does not drink alcohol and does not use drugs.   Family History: The patient's family history includes Abdominal Wall Hernia in her father; Anuerysm in her father; Asthma in her father; Cancer in her maternal uncle, paternal aunt, and sister; Diabetes in her father; Heart failure in her father; Hyperlipidemia in her father; Hypertension in her maternal aunt; Stroke in her mother.   ROS:  Please see the history of present illness. Otherwise, complete review of systems is positive for none  All other systems are reviewed and negative.   Physical Exam: VS:  BP 130/80   Pulse 67   Ht 5' 8.5 (1.74 m)   Wt 168 lb 12.8 oz (76.6 kg)   SpO2 97%   BMI 25.29 kg/m , BMI Body mass index is 25.29 kg/m.  Wt Readings from Last 3 Encounters:  10/24/23 168 lb 12.8 oz (76.6 kg)  12/25/21 140 lb (63.5 kg)  12/11/20 139 lb (63 kg)    General: Patient appears comfortable at rest. HEENT: Conjunctiva and lids normal, oropharynx clear with moist mucosa. Neck: Supple, no elevated JVP or carotid bruits, no thyromegaly. Lungs: Clear to auscultation, nonlabored breathing at rest. Cardiac: Regular rate and rhythm, no S3 or  significant systolic murmur, no pericardial rub. Abdomen: Soft, nontender, no hepatomegaly, bowel sounds present, no guarding or rebound. Extremities: No pitting edema, distal pulses 2+. Skin: Warm and dry. Musculoskeletal: No kyphosis. Neuropsychiatric: Alert and oriented x3, affect grossly appropriate.  Recent Labwork: No results found for requested labs within last 365 days.  No results found for: CHOL, TRIG, HDL, CHOLHDL, VLDL, LDLCALC, LDLDIRECT   Assessment and Plan:  Abnormal EKG: Self reported history of RBBB but EKG performed at PCP's office showed NSR,  non-specific TWI in V2 and V3. No angina or DOE. EKG today in the clinic showed NSR, LVH with repolarization abnormality.  Prior EKGs also showed the same.  Check BPs at home and follow-up with PCP.  Discussed symptoms of CAD and MI. Heart healthy diet and exercise recommended.       Medication Adjustments/Labs and Tests Ordered: Current medicines are reviewed at length with the patient today.  Concerns regarding medicines are outlined above.    Disposition:  Follow up prn  Signed Jakin Pavao Arleta Maywood, MD, 10/24/2023 9:29 AM    [facility] [address] [place], [place], [place] [address]

## 2023-10-29 ENCOUNTER — Other Ambulatory Visit: Admitting: Urology

## 2023-11-06 ENCOUNTER — Ambulatory Visit (HOSPITAL_COMMUNITY)
Admission: RE | Admit: 2023-11-06 | Discharge: 2023-11-06 | Disposition: A | Discharge status: Home / Self Care | Source: Ambulatory Visit | Attending: Urology | Admitting: Urology

## 2023-11-06 DIAGNOSIS — R319 Hematuria, unspecified: Secondary | ICD-10-CM | POA: Insufficient documentation

## 2023-11-06 DIAGNOSIS — Z87442 Personal history of urinary calculi: Secondary | ICD-10-CM | POA: Diagnosis present

## 2023-11-06 DIAGNOSIS — F17200 Nicotine dependence, unspecified, uncomplicated: Secondary | ICD-10-CM | POA: Diagnosis present

## 2023-11-06 LAB — POCT I-STAT CREATININE: Creatinine, Ser: 0.7 mg/dL (ref 0.44–1.00)

## 2023-11-06 MED ORDER — IOHEXOL 300 MG/ML  SOLN
125.0000 mL | Freq: Once | INTRAMUSCULAR | Status: AC | PRN
  Administered 2023-11-06: 125 mL via INTRAVENOUS

## 2023-11-12 ENCOUNTER — Other Ambulatory Visit: Admitting: Urology

## 2023-11-12 DIAGNOSIS — R319 Hematuria, unspecified: Secondary | ICD-10-CM

## 2023-11-12 DIAGNOSIS — F17200 Nicotine dependence, unspecified, uncomplicated: Secondary | ICD-10-CM

## 2024-01-08 ENCOUNTER — Other Ambulatory Visit: Admitting: Urology
